# Patient Record
Sex: Female | Born: 1995 | Race: White | Hispanic: No | Marital: Single | Smoking: Current every day smoker
Health system: Southern US, Community
[De-identification: ages and names within clinical notes are randomized; demographics above are authoritative.]

## PROBLEM LIST (undated history)

## (undated) HISTORY — PX: FOOT SURGERY: SHX648

---
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
## 2016-12-18 IMAGING — CR DG FINGER MIDDLE 2+V*R*
1 series · 3 of 3 positions shown · non-contrast
Comparison: None

CLINICAL DATA: Smashed RIGHT middle finger in door 2 days ago
tearing off nail, pain at distal phalanx

EXAM:
RIGHT MIDDLE FINGER 2+V

[Series 1: pa · 0.17mm/px · 3 of 3 slices shown]
[im 1/3]
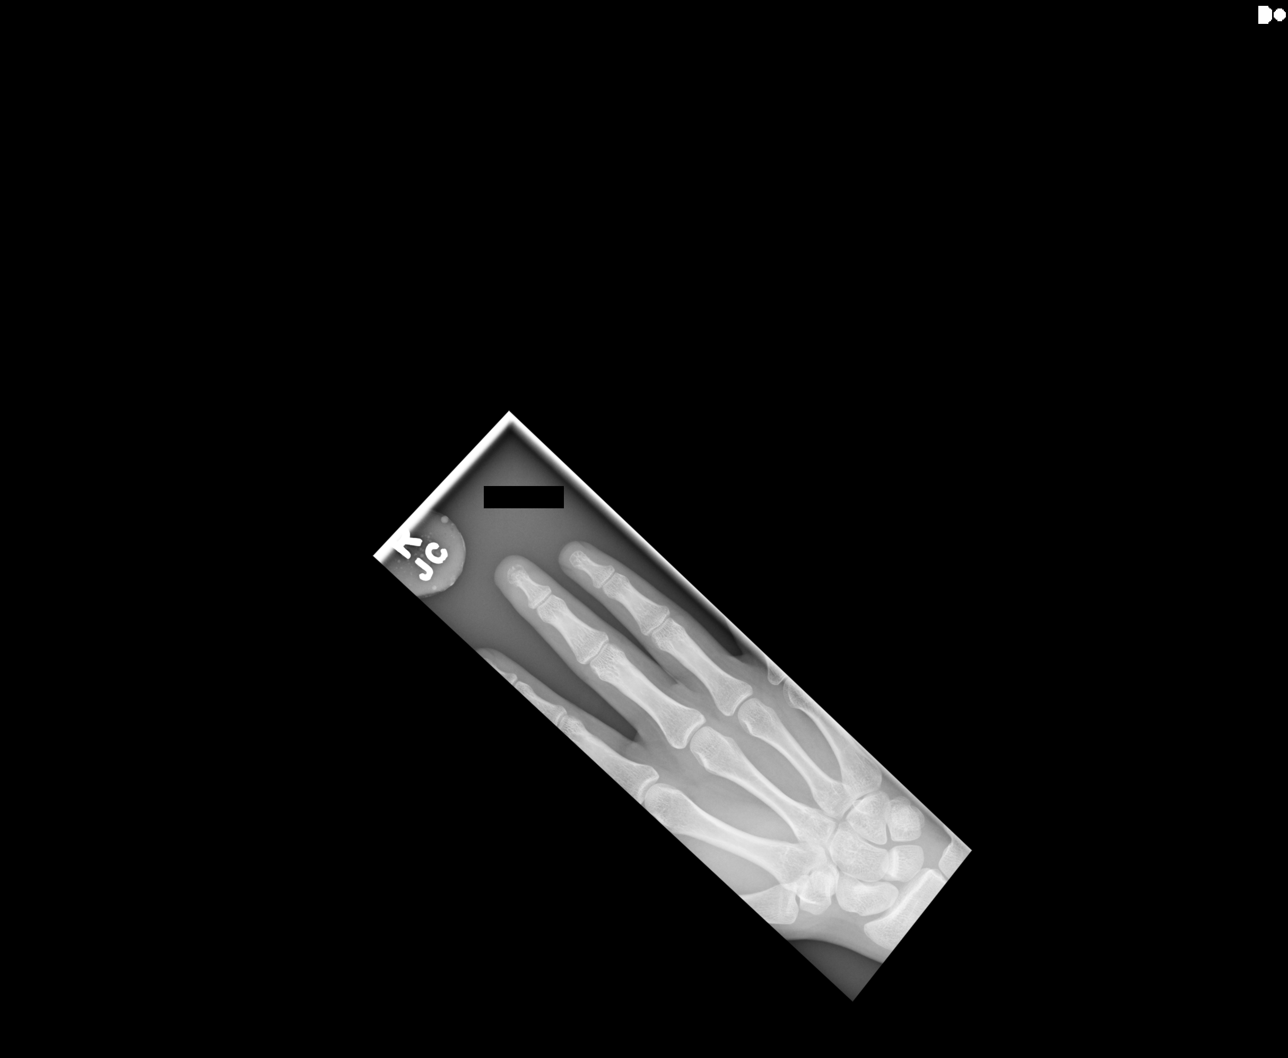
[im 2/3]
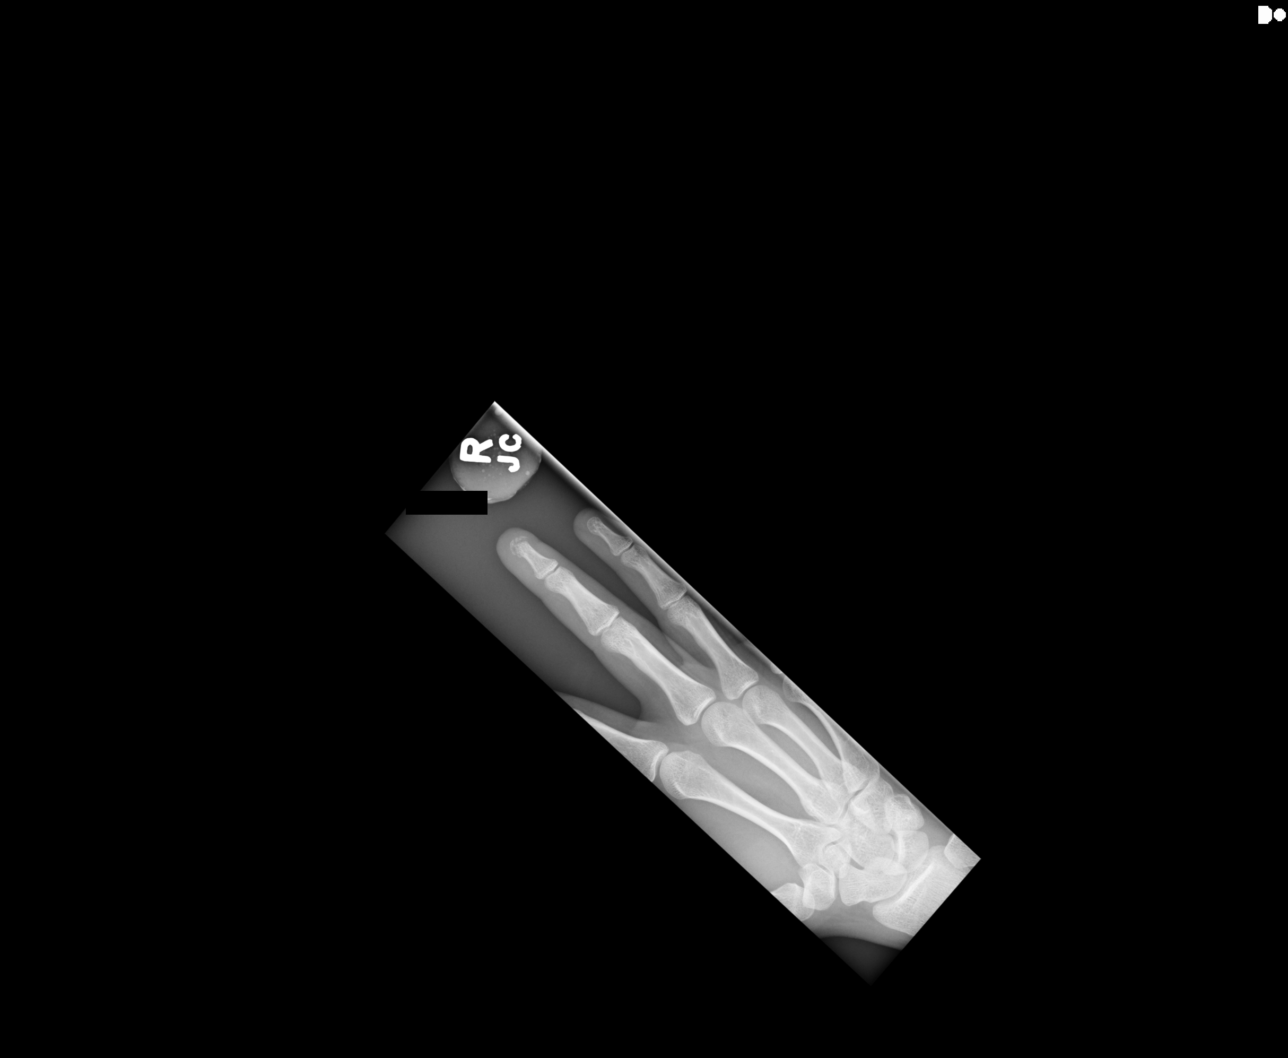
[im 3/3]
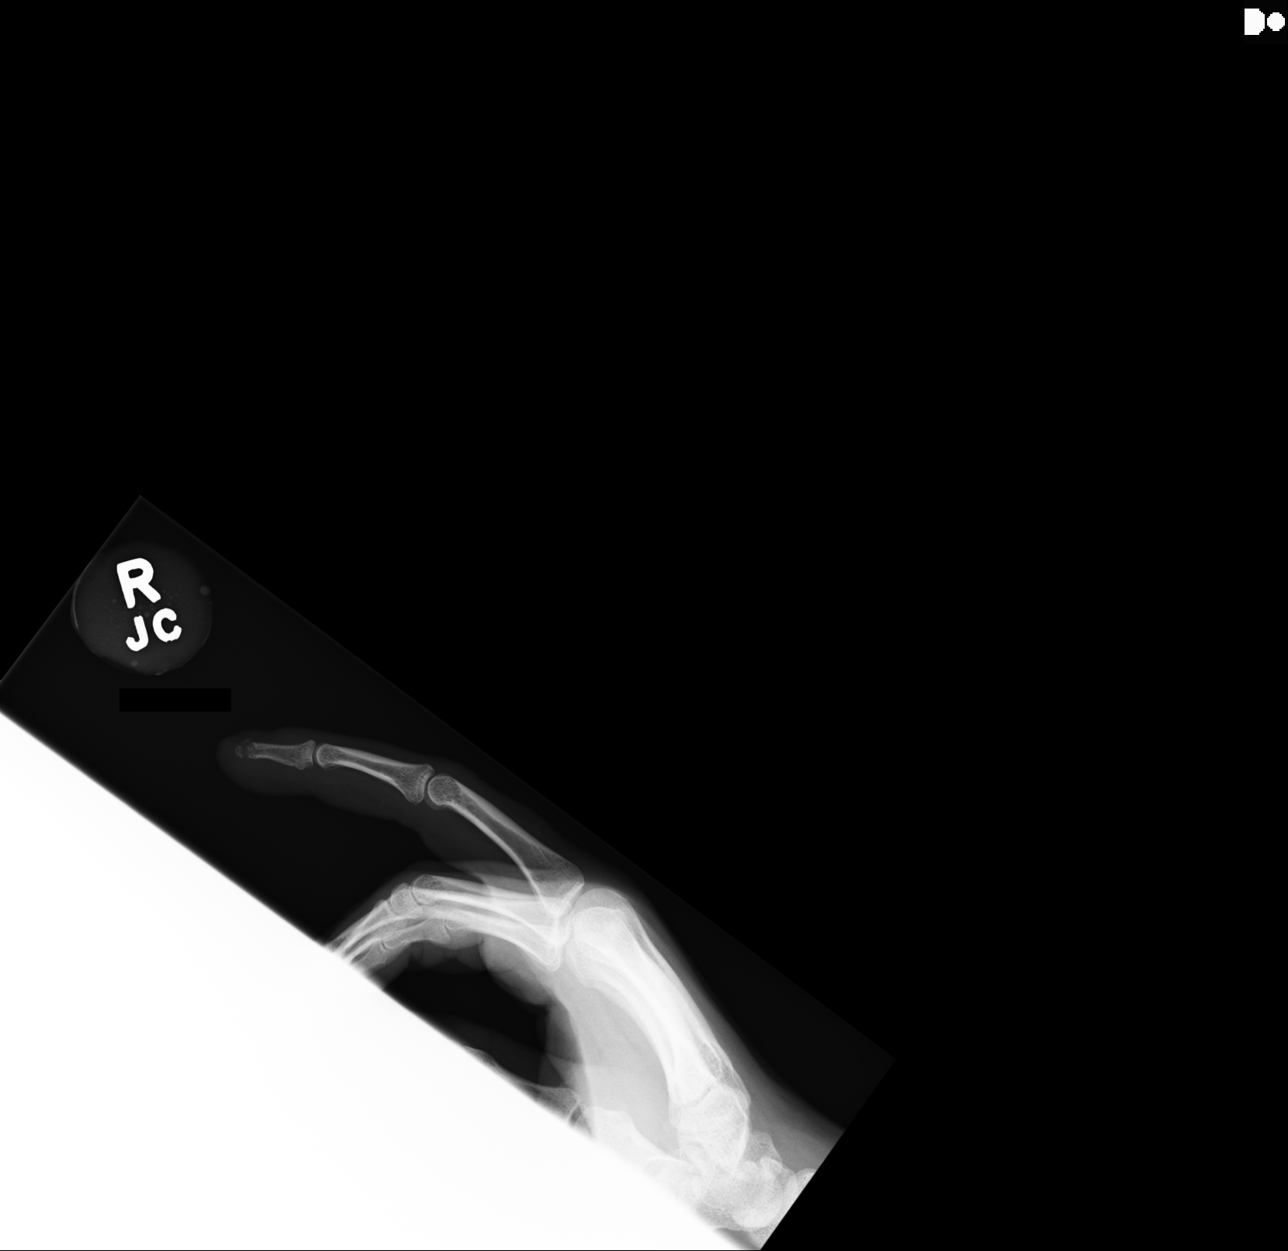

[3 of 3 positions shown; findings below may reference images not displayed]

FINDINGS: Osseous mineralization normal.

Joint spaces preserved.

Comminuted tuft fracture distal phalanx, minimally displaced.

No additional fracture, dislocation or bone destruction.

Associated soft tissue swelling.
IMPRESSION: Mildly displaced comminuted tuft fracture distal phalanx RIGHT
middle finger.

## 2017-10-05 ENCOUNTER — Encounter: Payer: Self-pay | Admitting: Emergency Medicine

## 2017-10-05 ENCOUNTER — Emergency Department
Admission: EM | Admit: 2017-10-05 | Discharge: 2017-10-05 | Disposition: A | Discharge status: Home / Self Care | Payer: BLUE CROSS/BLUE SHIELD | Attending: Emergency Medicine | Admitting: Emergency Medicine

## 2017-10-05 DIAGNOSIS — Y929 Unspecified place or not applicable: Secondary | ICD-10-CM | POA: Insufficient documentation

## 2017-10-05 DIAGNOSIS — S90852A Superficial foreign body, left foot, initial encounter: Secondary | ICD-10-CM | POA: Insufficient documentation

## 2017-10-05 DIAGNOSIS — B3731 Acute candidiasis of vulva and vagina: Secondary | ICD-10-CM

## 2017-10-05 DIAGNOSIS — B373 Candidiasis of vulva and vagina: Secondary | ICD-10-CM | POA: Insufficient documentation

## 2017-10-05 DIAGNOSIS — W458XXA Other foreign body or object entering through skin, initial encounter: Secondary | ICD-10-CM | POA: Insufficient documentation

## 2017-10-05 DIAGNOSIS — F17210 Nicotine dependence, cigarettes, uncomplicated: Secondary | ICD-10-CM | POA: Insufficient documentation

## 2017-10-05 DIAGNOSIS — Y999 Unspecified external cause status: Secondary | ICD-10-CM | POA: Insufficient documentation

## 2017-10-05 DIAGNOSIS — Y939 Activity, unspecified: Secondary | ICD-10-CM | POA: Insufficient documentation

## 2017-10-05 MED ORDER — FLUCONAZOLE 50 MG PO TABS
150.0000 mg | ORAL_TABLET | Freq: Once | ORAL | Status: AC
  Administered 2017-10-05: 150 mg via ORAL
  Filled 2017-10-05: qty 1

## 2017-10-05 MED ORDER — FLUCONAZOLE 150 MG PO TABS: 150.0000 mg | ORAL_TABLET | Freq: Once | ORAL | 0 refills | Status: AC

## 2017-10-05 NOTE — ED Triage Notes (Signed)
Pt comes into the ED via POV c/o glass in her left foot that occurred today as well as a possible yeast infection.  Patient states the glass is small and unable to be seen sticking out of the foot.  Patient c/o white thick discharge.  Patient also explains that it is itching.  Patient used monistat with no relief.

## 2017-10-05 NOTE — Discharge Instructions (Signed)
If no improvement of vaginal discharge in the next 2-3 days follow-up with the health department.  Return to the emergency department for any pain, fevers or worsening symptoms or urgent changes in your health.  Keep an eye on your left foot, if any redness warmth or drainage return to the emergency department.

## 2017-10-05 NOTE — ED Provider Notes (Signed)
[facility] EMERGENCY DEPARTMENT Provider Note   CSN: [id number] Arrival date & time: 10/05/17  1644     History   Chief Complaint Chief Complaint  Patient presents with  . Foreign Body in Skin  . Vaginitis    HPI Quintella ReichertKaleigh E Lusignan is a 21 y.o. female presents to the emergency department for evaluation of yeast infection and foreign body to the left foot.  Patient states of the last 2-3 days she has had white discharge with itching.  She states that she was recently went to the health department to have her Implanon removed, was swabbed and checked for STDs and bacterial vaginitis.  She states that over the last couple days she has had some white discharge with itching that resembles previous episode of Candida infection.  Recent episode responded well to Monistat, this episode is only minimally improved with Monistat.  She denies any abdominal pain, pelvic pain, fevers.  She denies any odor.  Patient also states earlier today she stepped on a small piece of glass in her bathroom.  Patient thought she had cleaned up all the broken glass from a week ago.  Her tetanus is up-to-date.  She has a small piece of glass on the plantar aspect of her left foot that is painful to walk.  She has not had medications for pain.  She denies any redness, warmth, swelling, drainage.  HPI  History reviewed. No pertinent past medical history.  There are no active problems to display for this patient.   Past Surgical History:  Procedure Laterality Date  . FOOT SURGERY      OB History    No data available       Home Medications    Prior to Admission medications   Medication Sig Start Date End Date Taking? Authorizing Provider  fluconazole (DIFLUCAN) 150 MG tablet Take 1 tablet (150 mg total) by mouth once for 1 dose. 10/05/17 10/05/17  Evon SlackGaines, Thomas C, PA-C  HYDROcodone-acetaminophen (NORCO) 5-325 MG per tablet Take 1-2 tablets by mouth every 4 (four) hours as needed for  moderate pain. 05/13/15   Beers, Charmayne Sheerharles M, PA-C  ibuprofen (ADVIL,MOTRIN) 800 MG tablet Take 1 tablet (800 mg total) by mouth every 8 (eight) hours as needed. 05/13/15   Evangeline DakinBeers, Charles M, PA-C    Family History No family history on file.  Social History Social History   Tobacco Use  . Smoking status: Current Every Day Smoker    Packs/day: 0.50    Types: Cigarettes  . Smokeless tobacco: Never Used  Substance Use Topics  . Alcohol use: Yes    Comment: occasional  . Drug use: Yes    Types: Marijuana     Allergies   Patient has no known allergies.   Review of Systems Review of Systems  Constitutional: Negative for fever.  Respiratory: Negative for shortness of breath.   Cardiovascular: Negative for chest pain.  Gastrointestinal: Negative for abdominal pain.  Genitourinary: Positive for vaginal discharge. Negative for difficulty urinating, dysuria, pelvic pain, urgency, vaginal bleeding and vaginal pain.  Musculoskeletal: Positive for gait problem. Negative for back pain and myalgias.  Skin: Negative for rash and wound.  Neurological: Negative for dizziness and headaches.     Physical Exam Updated Vital Signs BP 112/60 (BP Location: Right Arm)   Pulse 84   Temp 98.2 F (36.8 C) (Oral)   Resp 15   Ht 5\' 5"  (1.651 m)   Wt 99.8 kg (220 lb)   LMP  (  LMP Unknown)   SpO2 97%   BMI 36.61 kg/m   Physical Exam  Constitutional: She is oriented to person, place, and time. She appears well-developed and well-nourished. No distress.  HENT:  Head: Normocephalic and atraumatic.  Mouth/Throat: Oropharynx is clear and moist.  Eyes: Conjunctivae are normal. Right eye exhibits no discharge. Left eye exhibits no discharge.  Neck: Normal range of motion.  Cardiovascular: Normal rate.  Pulmonary/Chest: No respiratory distress.  Abdominal: Soft. She exhibits no distension. There is no tenderness. There is no guarding.  Musculoskeletal: Normal range of motion. She exhibits no  deformity.  Examination of the left foot shows a small piece of glass that is barely visible but palpable along the superficial portion of the plantar aspect of the left foot.  Patient is tender to palpation.  This is successfully removed with scalpel and forceps.  There was no redness, warmth, drainage.  Piece of glass was extremely tiny, less than 1 mm in length in diameter.  After removal of the piece of glass, patient without any pain or discomfort with palpation or weightbearing.  Neurological: She is alert and oriented to person, place, and time. She has normal reflexes.  Skin: Skin is warm and dry. No rash noted.  Psychiatric: She has a normal mood and affect. Her behavior is normal. Thought content normal.     ED Treatments / Results  Labs (all labs ordered are listed, but only abnormal results are displayed) Labs Reviewed - No data to display  EKG  EKG Interpretation None       Radiology No results found.  Procedures .Foreign Body Removal Date/Time: 10/05/2017 5:56 PM Performed by: Evon SlackGaines, Thomas C, PA-C Authorized by: Evon SlackGaines, Thomas C, PA-C  Consent: Verbal consent obtained. Risks and benefits: risks, benefits and alternatives were discussed Consent given by: patient Patient understanding: patient states understanding of the procedure being performed Imaging studies: imaging studies not available Patient identity confirmed: verbally with patient Body area: skin General location: lower extremity Location details: left foot Anesthesia method: none. Patient cooperative: yes Removal mechanism: forceps and scalpel Dressing: dressing applied Tendon involvement: none Depth: Superficial  Complexity: simple 1 objects recovered. Objects recovered: Small piece of glass Post-procedure assessment: foreign body removed Patient tolerance: Patient tolerated the procedure well with no immediate complications   (including critical care time)  Medications Ordered in  ED Medications  fluconazole (DIFLUCAN) tablet 150 mg (not administered)     Initial Impression / Assessment and Plan / ED Course  I have reviewed the triage vital signs and the nursing notes.  Pertinent labs & imaging results that were available during my care of the patient were reviewed by me and considered in my medical decision making (see chart for details).     21 year old female with foreign body removal of the left foot.  Superficial piece of glass was removed.  Tetanus is up-to-date.  Pain from foreign body resolved after removal.  Patient was educated on wound care and signs and symptoms to return to the ED for.  The foreign body was extremely superficial and tiny.  Patient also with signs and symptoms consistent with vaginal candidiasis.  She has had recent workup at health department that was negative.  Will treat with fluconazole.  If no improvement, she will follow-up with health department. Final Clinical Impressions(s) / ED Diagnoses   Final diagnoses:  Vaginitis due to Candida  Foreign body in left foot, initial encounter    ED Discharge Orders  Ordered    fluconazole (DIFLUCAN) 150 MG tablet   Once     10/05/17 1747       Ronnette Juniper 10/05/17 1759    Phineas Semen, MD 10/05/17 [phone]

## 2017-10-10 ENCOUNTER — Encounter: Payer: Self-pay | Admitting: Emergency Medicine

## 2017-10-10 ENCOUNTER — Other Ambulatory Visit: Payer: Self-pay

## 2017-10-10 ENCOUNTER — Emergency Department
Admission: EM | Admit: 2017-10-10 | Discharge: 2017-10-10 | Disposition: A | Discharge status: Home / Self Care | Payer: Self-pay | Attending: Emergency Medicine | Admitting: Emergency Medicine

## 2017-10-10 DIAGNOSIS — B3731 Acute candidiasis of vulva and vagina: Secondary | ICD-10-CM

## 2017-10-10 DIAGNOSIS — B373 Candidiasis of vulva and vagina: Secondary | ICD-10-CM | POA: Insufficient documentation

## 2017-10-10 DIAGNOSIS — F17210 Nicotine dependence, cigarettes, uncomplicated: Secondary | ICD-10-CM | POA: Insufficient documentation

## 2017-10-10 MED ORDER — FLUCONAZOLE 50 MG PO TABS
150.0000 mg | ORAL_TABLET | Freq: Once | ORAL | Status: AC
  Administered 2017-10-10: 150 mg via ORAL
  Filled 2017-10-10: qty 1

## 2017-10-10 MED ORDER — FLUCONAZOLE 100 MG PO TABS: ORAL_TABLET | ORAL | 0 refills | Status: DC

## 2017-10-10 NOTE — ED Notes (Signed)
Pt states currently having white thick discharge that started around a week ago. Already tried monistat with no relief.

## 2017-10-10 NOTE — ED Triage Notes (Signed)
Arrives with c/o yeast infection.  Patient states she was seen through the health department and was diagnosed with yeast, given diflucan, which has been ineffective.

## 2017-10-10 NOTE — ED Provider Notes (Signed)
[facility] Emergency Department Provider Note  ____________________________________________  Time seen: Approximately 7:16 PM  I have reviewed the triage vital signs and the nursing notes.   HISTORY  Chief Complaint Vaginal Discharge    HPI Samantha Jackson is a 21 y.o. female that presents to emergency department for evaluation of white vaginal discharge for 1.5 weeks.  Patient went to the health department and tested positive for yeast.  She was checked for STDs, which tested negative.  She was given a dose of Diflucan in the ED, which helped symptoms but did they did not resolve.  No fever, chills, nausea, vomiting, abdominal pain, dysuria, urgency, frequency.   History reviewed. No pertinent past medical history.  There are no active problems to display for this patient.   Past Surgical History:  Procedure Laterality Date  . FOOT SURGERY      Prior to Admission medications   Medication Sig Start Date End Date Taking? Authorizing Provider  fluconazole (DIFLUCAN) 100 MG tablet Take 1.5 tablets on 12/1 and take 1.5 tablets on 12/4 10/10/17   Enid DerryWagner, Ladale Sherburn, PA-C  HYDROcodone-acetaminophen (NORCO) 5-325 MG per tablet Take 1-2 tablets by mouth every 4 (four) hours as needed for moderate pain. 05/13/15   Beers, Charmayne Sheerharles M, PA-C  ibuprofen (ADVIL,MOTRIN) 800 MG tablet Take 1 tablet (800 mg total) by mouth every 8 (eight) hours as needed. 05/13/15   Evangeline DakinBeers, Charles M, PA-C    Allergies Patient has no known allergies.  No family history on file.  Social History Social History   Tobacco Use  . Smoking status: Current Every Day Smoker    Packs/day: 0.50    Types: Cigarettes  . Smokeless tobacco: Never Used  Substance Use Topics  . Alcohol use: Yes    Comment: occasional  . Drug use: Yes    Types: Marijuana     Review of Systems  Constitutional: No fever/chills Cardiovascular: No chest pain. Respiratory: No SOB. Gastrointestinal: No abdominal  pain.  No nausea, no vomiting.  Musculoskeletal: Negative for musculoskeletal pain. Skin: Negative for rash, abrasions, lacerations, ecchymosis. Neurological: Negative for headaches, numbness or tingling   ____________________________________________   PHYSICAL EXAM:  VITAL SIGNS: ED Triage Vitals  Enc Vitals Group     BP 10/10/17 1819 126/65     Pulse Rate 10/10/17 1819 81     Resp 10/10/17 1819 20     Temp 10/10/17 1819 98.2 F (36.8 C)     Temp Source 10/10/17 1819 Oral     SpO2 10/10/17 1819 100 %     Weight 10/10/17 1815 200 lb (90.7 kg)     Height 10/10/17 1815 5\' 5"  (1.651 m)     Head Circumference --      Peak Flow --      Pain Score 10/10/17 1815 0     Pain Loc --      Pain Edu? --      Excl. in GC? --      Constitutional: Alert and oriented. Well appearing and in no acute distress. Eyes: Conjunctivae are normal. PERRL. EOMI. Head: Atraumatic. ENT:      Ears:      Nose: No congestion/rhinnorhea.      Mouth/Throat: Mucous membranes are moist.  Neck: No stridor.   Cardiovascular: Normal rate, regular rhythm.  Good peripheral circulation. Respiratory: Normal respiratory effort without tachypnea or retractions. Lungs CTAB. Good air entry to the bases with no decreased or absent breath sounds. Gastrointestinal: Bowel sounds 4 quadrants. Soft  and nontender to palpation. No guarding or rigidity. No palpable masses. No distention Musculoskeletal: Full range of motion to all extremities. No gross deformities appreciated. Neurologic:  Normal speech and language. No gross focal neurologic deficits are appreciated.  Skin:  Skin is warm, dry and intact. No rash noted.   ____________________________________________   LABS (all labs ordered are listed, but only abnormal results are displayed)  Labs Reviewed - No data to display ____________________________________________  EKG   ____________________________________________  RADIOLOGY   No results  found.  ____________________________________________    PROCEDURES  Procedure(s) performed:    Procedures    Medications  fluconazole (DIFLUCAN) tablet 150 mg (not administered)     ____________________________________________   INITIAL IMPRESSION / ASSESSMENT AND PLAN / ED COURSE  Pertinent labs & imaging results that were available during my care of the patient were reviewed by me and considered in my medical decision making (see chart for details).  Review of the [place] CSRS was performed in accordance of the [facility] prior to dispensing any controlled drugs.  Patient's diagnosis is consistent with yeast infection.  Vital signs and exam are reassuring.  Patient tested positive for yeast at the health department 1.5 weeks ago.  She has taken 1 dose of Diflucan, which improved but has not resolved symptoms. We will try 3 doses of diflucan 72 hours apart.  Patient will be discharged home with prescriptions for diflucan. Patient is to follow up with health department as directed. Patient is given ED precautions to return to the ED for any worsening or new symptoms.   ____________________________________________  FINAL CLINICAL IMPRESSION(S) / ED DIAGNOSES  Final diagnoses:  Vaginal yeast infection       This chart was dictated using voice recognition software/Dragon. Despite best efforts to proofread, errors can occur which can change the meaning. Any change was purely unintentional.   Enid DerryWagner, Graycen Sadlon, PA-C 10/10/17 1956  Sharman CheekStafford, Phillip, MD 10/10/17 2308

## 2017-11-08 ENCOUNTER — Emergency Department
Admission: EM | Admit: 2017-11-08 | Discharge: 2017-11-08 | Disposition: A | Discharge status: Home / Self Care | Payer: Self-pay | Attending: Emergency Medicine | Admitting: Emergency Medicine

## 2017-11-08 ENCOUNTER — Other Ambulatory Visit: Payer: Self-pay

## 2017-11-08 DIAGNOSIS — N946 Dysmenorrhea, unspecified: Secondary | ICD-10-CM | POA: Insufficient documentation

## 2017-11-08 LAB — URINALYSIS, COMPLETE (UACMP) WITH MICROSCOPIC
BILIRUBIN URINE: NEGATIVE
Bacteria, UA: NONE SEEN
GLUCOSE, UA: NEGATIVE mg/dL
Hgb urine dipstick: NEGATIVE
Ketones, ur: NEGATIVE mg/dL
Leukocytes, UA: NEGATIVE
Nitrite: NEGATIVE
PROTEIN: NEGATIVE mg/dL
RBC / HPF: NONE SEEN RBC/hpf (ref 0–5)
Specific Gravity, Urine: 1.005 (ref 1.005–1.030)
WBC, UA: NONE SEEN WBC/hpf (ref 0–5)
pH: 6 (ref 5.0–8.0)

## 2017-11-08 LAB — POCT PREGNANCY, URINE: Preg Test, Ur: NEGATIVE

## 2017-11-08 MED ORDER — IBUPROFEN 800 MG PO TABS
800.0000 mg | ORAL_TABLET | Freq: Once | ORAL | Status: AC
  Administered 2017-11-08: 800 mg via ORAL
  Filled 2017-11-08: qty 1

## 2017-11-08 MED ORDER — IBUPROFEN 800 MG PO TABS
800.0000 mg | ORAL_TABLET | Freq: Once | ORAL | Status: AC
  Administered 2017-11-08: 800 mg via ORAL

## 2017-11-08 MED ORDER — IBUPROFEN 800 MG PO TABS
ORAL_TABLET | ORAL | Status: AC
  Filled 2017-11-08: qty 1

## 2017-11-08 NOTE — ED Provider Notes (Signed)
[facility] Emergency Department Provider Note   First MD Initiated Contact with Patient 11/08/17 [phone]     (approximate)  I have reviewed the triage vital signs and the nursing notes.   HISTORY  Chief Complaint Abdominal Pain   HPI Samantha Jackson is a 21 y.o. female presents to the emergency department with "painful.".  Patient states that she had Nexplanon removed on 10/28/2018 because "I want to be regulated again".  Patient states on December 25 menstrual cramps started followed by vaginal bleeding.  Patient pain score on arrival 7 out of 10.  Patient received ibuprofen 800 mg while in the waiting room with improvement of pain.  Patient denies any vomiting no diarrhea.  Patient denies any fever afebrile on presentation.  Patient has no plans for birth control at this time.  Past medical history None There are no active problems to display for this patient.   Past Surgical History:  Procedure Laterality Date  . FOOT SURGERY      Prior to Admission medications   Medication Sig Start Date End Date Taking? Authorizing Provider  fluconazole (DIFLUCAN) 100 MG tablet Take 1.5 tablets on 12/1 and take 1.5 tablets on 12/4 10/10/17   Enid DerryWagner, Ashley, PA-C  HYDROcodone-acetaminophen (NORCO) 5-325 MG per tablet Take 1-2 tablets by mouth every 4 (four) hours as needed for moderate pain. 05/13/15   Beers, Charmayne Sheerharles M, PA-C  ibuprofen (ADVIL,MOTRIN) 800 MG tablet Take 1 tablet (800 mg total) by mouth every 8 (eight) hours as needed. 05/13/15   Beers, Charmayne Sheerharles M, PA-C    Allergies No known drug allergies No family history on file.  Social History Social History   Tobacco Use  . Smoking status: Current Every Day Smoker    Packs/day: 0.50    Types: Cigarettes  . Smokeless tobacco: Never Used  Substance Use Topics  . Alcohol use: Yes    Comment: occasional  . Drug use: Yes    Types: Marijuana    Review of Systems Constitutional: No fever/chills Eyes: No  visual changes. ENT: No sore throat. Cardiovascular: Denies chest pain. Respiratory: Denies shortness of breath. Gastrointestinal: No abdominal pain.  No nausea, no vomiting.  No diarrhea.  No constipation. Genitourinary: Negative for dysuria.  Positive for pelvic pain. Musculoskeletal: Negative for neck pain.  Negative for back pain. Integumentary: Negative for rash. Neurological: Negative for headaches, focal weakness or numbness.   ____________________________________________   PHYSICAL EXAM:  VITAL SIGNS: ED Triage Vitals [11/08/17 0016]  Enc Vitals Group     BP 129/80     Pulse Rate 95     Resp 18     Temp 98 F (36.7 C)     Temp Source Oral     SpO2 98 %     Weight 99.8 kg (220 lb)     Height 1.524 m (5')     Head Circumference      Peak Flow      Pain Score 7     Pain Loc      Pain Edu?      Excl. in GC?     Constitutional: Alert and oriented. Well appearing and in no acute distress. Eyes: Conjunctivae are normal.  Head: Atraumatic. Mouth/Throat: Mucous membranes are moist. Oropharynx non-erythematous Neck: No stridor.   Cardiovascular: Normal rate, regular rhythm. Good peripheral circulation. Grossly normal heart sounds. Respiratory: Normal respiratory effort.  No retractions. Lungs CTAB. Gastrointestinal: Soft and nontender. No distention.  Musculoskeletal: No lower extremity tenderness nor  edema. No gross deformities of extremities. Neurologic:  Normal speech and language. No gross focal neurologic deficits are appreciated.  Skin:  Skin is warm, dry and intact. No rash noted. Psychiatric: Mood and affect are normal. Speech and behavior are normal.  ____________________________________________   LABS (all labs ordered are listed, but only abnormal results are displayed)  Labs Reviewed  URINALYSIS, COMPLETE (UACMP) WITH MICROSCOPIC - Abnormal; Notable for the following components:      Result Value   Color, Urine STRAW (*)    APPearance CLEAR (*)     Squamous Epithelial / LPF 0-5 (*)    All other components within normal limits  POC URINE PREG, ED  POCT PREGNANCY, URINE       Procedures   ____________________________________________   INITIAL IMPRESSION / ASSESSMENT AND PLAN / ED COURSE  As part of my medical decision making, I reviewed the following data within the electronic MEDICAL RECORD 89 year old female present with history of physical exam consistent with painful menstruation.  Patient given ibuprofen with the patient states that she is unable to purchase any medication for discomfort and as such she was given ibuprofen 800 mg to be taking in 8 hours as needed.   ________________________________  FINAL CLINICAL IMPRESSION(S) / ED DIAGNOSES  Final diagnoses:  Menstrual pain     MEDICATIONS GIVEN DURING THIS VISIT:  Medications  ibuprofen (ADVIL,MOTRIN) tablet 800 mg (800 mg Oral Given 11/08/17 0022)     ED Discharge Orders    None       Note:  This document was prepared using Dragon voice recognition software and may include unintentional dictation errors.    Darci CurrentBrown, [place] N, MD 11/08/17 [phone]

## 2017-11-08 NOTE — ED Notes (Signed)
ED Provider at bedside. 

## 2017-11-08 NOTE — ED Notes (Signed)

## 2017-11-08 NOTE — ED Triage Notes (Signed)
Pt had IUD taken out 12/16 and started first cycle since insertion 3 years ago. Here for severe cramps, states "cannot afford midol".

## 2019-03-19 ENCOUNTER — Encounter: Payer: Self-pay | Admitting: Emergency Medicine

## 2019-03-19 ENCOUNTER — Other Ambulatory Visit: Payer: Self-pay

## 2019-03-19 ENCOUNTER — Ambulatory Visit
Admission: EM | Admit: 2019-03-19 | Discharge: 2019-03-19 | Disposition: A | Discharge status: Home / Self Care | Payer: BLUE CROSS/BLUE SHIELD | Attending: Family Medicine | Admitting: Family Medicine

## 2019-03-19 DIAGNOSIS — J029 Acute pharyngitis, unspecified: Secondary | ICD-10-CM | POA: Diagnosis not present

## 2019-03-19 LAB — RAPID STREP SCREEN (MED CTR MEBANE ONLY): Streptococcus, Group A Screen (Direct): NEGATIVE

## 2019-03-19 MED ORDER — AMOXICILLIN 500 MG PO TABS: 500.0000 mg | ORAL_TABLET | Freq: Two times a day (BID) | ORAL | 0 refills | Status: AC

## 2019-03-19 NOTE — ED Triage Notes (Signed)
Patient c/o sore throat that started 2 days ago. She states she has white spots on the back of her throat.

## 2019-03-19 NOTE — ED Provider Notes (Signed)
[facility]  CSN: [id number] Arrival date & time: 03/19/19  1413  History   Chief Complaint Chief Complaint  Patient presents with  . Sore Throat   HPI 23 year old female presents with sore throat.  Patient reports a 2-day history of sore throat.  Worse with swallowing.  She has noticed white exudate.  No fever.  No chills.  She reports lymphadenopathy as well.  No reported sick contacts.  No recent travel.  She has used salt water gargles and peroxide with some improvement.  Her pain is mild currently, 2/10 in severity.  No other associated symptoms.  No other complaints or concerns at this time.  Hx reviewed as below. PMH: Obesity.  Past Surgical History:  Procedure Laterality Date  . FOOT SURGERY     OB History   No obstetric history on file.    Home Medications    Prior to Admission medications   Medication Sig Start Date End Date Taking? Authorizing Provider  amoxicillin (AMOXIL) 500 MG tablet Take 1 tablet (500 mg total) by mouth 2 (two) times daily. 03/19/19   Tommie Sams, DO   Social History Social History   Tobacco Use  . Smoking status: Current Every Day Smoker    Packs/day: 0.50    Types: Cigarettes  . Smokeless tobacco: Never Used  Substance Use Topics  . Alcohol use: Yes    Comment: occasional  . Drug use: Not Currently    Types: Marijuana    Allergies   Patient has no known allergies.   Review of Systems Review of Systems  Constitutional: Negative for fever.  HENT: Positive for sore throat and trouble swallowing.   Hematological: Positive for adenopathy.   Physical Exam Triage Vital Signs ED Triage Vitals  Enc Vitals Group     BP 03/19/19 1429 (!) 143/82     Pulse Rate 03/19/19 1429 98     Resp 03/19/19 1429 18     Temp 03/19/19 1429 98.6 F (37 C)     Temp Source 03/19/19 1429 Oral     SpO2 03/19/19 1429 98 %     Weight 03/19/19 1430 215 lb (97.5 kg)     Height 03/19/19 1430 5\' 5"  (1.651 m)     Head Circumference --    Peak Flow --      Pain Score 03/19/19 1429 2     Pain Loc --      Pain Edu? --      Excl. in GC? --    Updated Vital Signs BP (!) 143/82 (BP Location: Right Arm)   Pulse 98   Temp 98.6 F (37 C) (Oral)   Resp 18   Ht 5\' 5"  (1.651 m)   Wt 97.5 kg   LMP 03/12/2019   SpO2 98%   BMI 35.78 kg/m   Visual Acuity Right Eye Distance:   Left Eye Distance:   Bilateral Distance:    Right Eye Near:   Left Eye Near:    Bilateral Near:     Physical Exam Vitals signs and nursing note reviewed.  Constitutional:      General: She is not in acute distress.    Appearance: Normal appearance.  HENT:     Head: Normocephalic and atraumatic.     Mouth/Throat:     Comments: Oropharynx with moderate erythema.  Moderate tonsillar swelling.  Bilateral tonsillar exudate. Eyes:     General:        Right eye: No discharge.  Left eye: No discharge.     Conjunctiva/sclera: Conjunctivae normal.  Neck:     Musculoskeletal: Neck supple.  Cardiovascular:     Rate and Rhythm: Normal rate and regular rhythm.  Pulmonary:     Effort: Pulmonary effort is normal.     Breath sounds: Normal breath sounds.  Lymphadenopathy:     Cervical: Cervical adenopathy present.  Neurological:     Mental Status: She is alert.  Psychiatric:        Mood and Affect: Mood normal.        Behavior: Behavior normal.    UC Treatments / Results  Labs (all labs ordered are listed, but only abnormal results are displayed) Labs Reviewed  RAPID STREP SCREEN ([facility] ONLY)  CULTURE, GROUP A STREP [facility])    EKG None  Radiology No results found.  Procedures Procedures (including critical care time)  Medications Ordered in UC Medications - No data to display  Initial Impression / Assessment and Plan / UC Course  I have reviewed the triage vital signs and the nursing notes.  Pertinent labs & imaging results that were available during my care of the patient were reviewed by me and considered in my  medical decision making (see chart for details).    23 year old female presents with pharyngitis.  Strep was negative today.  Awaiting culture.  Given her clinical exam, I am placing her on amoxicillin while a culture.  Final Clinical Impressions(s) / UC Diagnoses   Final diagnoses:  Pharyngitis, unspecified etiology     Discharge Instructions     Antibiotic as prescribed (while we await the culture).  Call with concerns.  Take care  Dr. Adriana Simasook    ED Prescriptions    Medication Sig Dispense Auth. Provider   amoxicillin (AMOXIL) 500 MG tablet Take 1 tablet (500 mg total) by mouth 2 (two) times daily. 20 tablet Tommie Samsook, Merial Moritz G, DO     Controlled Substance Prescriptions [place] Controlled Substance Registry consulted? Not Applicable   Tommie SamsCook, Rosabell Geyer G, DO 03/19/19 1526

## 2019-03-19 NOTE — Discharge Instructions (Signed)
Antibiotic as prescribed (while we await the culture).  Call with concerns.  Take care  Dr. Adriana Simas

## 2019-03-22 LAB — CULTURE, GROUP A STREP (THRC)

## 2019-08-01 ENCOUNTER — Encounter: Admit: 2019-08-01 | Discharge: 2019-08-01 | Payer: BC Managed Care – PPO

## 2019-08-01 ENCOUNTER — Ambulatory Visit: Admit: 2019-08-01 | Discharge: 2019-08-02 | Payer: BC Managed Care – PPO

## 2019-08-01 MED ORDER — CEPHALEXIN 500 MG PO CAP
500 mg | ORAL_CAPSULE | Freq: Four times a day (QID) | ORAL | 0 refills | Status: AC
Start: 2019-08-01 — End: ?

## 2019-08-01 MED ORDER — CEPHALEXIN 500 MG PO CAP
500 mg | ORAL_CAPSULE | Freq: Four times a day (QID) | ORAL | 0 refills | Status: DC
Start: 2019-08-01 — End: 2019-08-01

## 2019-08-01 NOTE — Progress Notes
Date of Service: 08/01/2019    Christine Bush is a 23 y.o. female.  DOB: 08/31/1996  MRN: [id number]       Subjective:            Chief Complaint   Patient presents with   ? Sore Throat     pt stated it all started on Tuesday   ? Bladder infection            Sore Throat    This is a new problem. The current episode started in the past 7 days (started 07/29/2019). The problem has been gradually worsening. The pain is worse on the left side. There has been no fever. The pain is at a severity of 6/10. The pain is moderate. Associated symptoms include a hoarse voice, swollen glands and trouble swallowing. Pertinent negatives include no abdominal pain, congestion, coughing, diarrhea, ear pain, plugged ear sensation, shortness of breath or vomiting. She has tried NSAIDs and cool liquids for the symptoms. The treatment provided mild relief.   Urinary Pain    This is a new problem. The current episode started in the past 7 days (started 07/29/2019). The problem occurs every urination. The problem has been gradually worsening. The quality of the pain is described as burning and aching. The pain is at a severity of 4/10. The pain is mild. She is sexually active. There is no history of pyelonephritis. Associated symptoms include frequency and hematuria. Pertinent negatives include no discharge, flank pain, nausea, possible pregnancy (LMP: 07/09/2019), urgency or vomiting. She has tried increased fluids (took AZO on Wednesday which helped) for the symptoms. The treatment provided mild relief. There is no history of kidney stones, recurrent UTIs or a single kidney.     She denies any history of strep throat.     Fatuma Canizales has no past medical history on file.    She has no past surgical history on file.    Fayette's family history is not on file.           Review of Systems   Constitutional: Negative for fever.   HENT: Positive for hoarse voice, sore throat and trouble swallowing. Negative for congestion, ear pain and rhinorrhea. Respiratory: Negative for cough and shortness of breath.    Gastrointestinal: Negative for abdominal pain, diarrhea, nausea and vomiting.   Genitourinary: Positive for dysuria, frequency and hematuria. Negative for flank pain and urgency.         Objective:         ? cephalexin (KEFLEX) 500 mg capsule Take one capsule by mouth four times daily for 10 days.     Vitals:    08/01/19 1127   BP: 139/68   Pulse: 81   Resp: 18   Temp: 37.1 ?C (98.7 ?F)   TempSrc: Oral   SpO2: 100%   Weight: 66.9 kg (147 lb 6.4 oz)   Height: 175.3 cm (69)   PainSc: Six     Body mass index is 21.77 kg/m?Marland Kitchen     Physical Exam  Vitals signs and nursing note reviewed.   Constitutional:       General: She is not in acute distress.     Appearance: Normal appearance. She is well-developed. She is not ill-appearing, toxic-appearing or diaphoretic.   HENT:      Head: Normocephalic and atraumatic.      Right Ear: Hearing, tympanic membrane, ear canal and external ear normal.      Left Ear: Hearing, tympanic membrane, ear canal and external  ear normal.      Nose: Nose normal. No rhinorrhea.      Mouth/Throat:      Lips: Pink. No lesions.      Mouth: Mucous membranes are moist.      Pharynx: Pharyngeal swelling, oropharyngeal exudate and posterior oropharyngeal erythema present.      Tonsils: Tonsillar exudate present. 3+ on the right. 3+ on the left.   Eyes:      Pupils: Pupils are equal, round, and reactive to light.   Neck:      Musculoskeletal: Normal range of motion and neck supple.   Cardiovascular:      Rate and Rhythm: Normal rate and regular rhythm.      Heart sounds: Normal heart sounds.   Pulmonary:      Effort: Pulmonary effort is normal.      Breath sounds: Normal breath sounds.   Abdominal:      Tenderness: There is no abdominal tenderness. There is no right CVA tenderness or left CVA tenderness.   Musculoskeletal: Normal range of motion.   Lymphadenopathy:      Cervical: Cervical adenopathy present.   Skin:     General: Skin is warm. Neurological:      Mental Status: She is alert and oriented to person, place, and time.   Psychiatric:         Mood and Affect: Mood normal.         Speech: Speech normal.         Behavior: Behavior normal.         Thought Content: Thought content normal.         Judgment: Judgment normal.              Assessment and Plan:                Kiyo Tiffin was seen today for sore throat and bladder infection.    Diagnoses and all orders for this visit:    Tonsillitis with exudate    Sore throat  -     POC RAPID STREP SCREEN  -     CULTURE-URINE W/SENSITIVITY; Future; Expected date: 08/01/2019  -     POC URINE DIPSTICK AUTO READ  -     CULTURE-RESP,UPPER-THROAT; Future; Expected date: 08/01/2019    Dysuria  -     POC URINE DIPSTICK AUTO READ  -     CULTURE-RESP,UPPER-THROAT; Future; Expected date: 08/01/2019    Urinary tract infection with hematuria, site unspecified    Other orders  -     Discontinue: cephalexin (KEFLEX) 500 mg capsule; Take one capsule by mouth four times daily for 10 days.  -     cephalexin (KEFLEX) 500 mg capsule; Take one capsule by mouth four times daily for 10 days.               Patient Instructions   You were seen today for Urinary pain and sore throat.    Your strep screen is negative, and a culture is pending. We will treat the UTI with an antibiotic that should cover your throat, should it be positive on the culture. If no improvement in the throat by Monday, consider being tested for Mono.     Your urine test was positive for white blood cells and blood.  A culture is pending. We will contact you if we need to switch to a different antibiotic.      You are being prescribed Keflex.    Continue taking  all medication until it is gone.  Return to clinic if fever develops, if pain worsens, or if you begin having nausea/vomiting.        Bladder Infection,?Female (Adult)    A bladder infection (cystitis or UTI) usually causes a constant urge to urinate and a burning when passing urine. Urine may be cloudy, smelly or dark. There may be pain in the lower abdomen. A bladder infection occurs when bacteria from the vaginal area enter the bladder opening (urethra). This can occur from sexual intercourse, wearing tight clothing, dehydration and other factors.  Home Care:  ? Drink lots of fluids (at least 6-8 glasses a day, unless you must restrict fluids for other medical reasons). This will force the medicine into your urinary system and flush the bacteria out of your body.  ? Avoid sexual intercourse until your symptoms are gone.  ? Avoid caffeine, alcohol and spicy foods. These can irritate the bladder.  ? A bladder infection is treated with antibiotics. You may also be given Pyridium (generic = phenazopyridine) to reduce the burning sensation. This medicine will cause your urine to become a bright orange color. The orange urine may stain clothing. You may wear a pad or panty-liner to protect clothing.  Preventing Future Infections:  ? Always wipe from front to back after a bowel movement.  ? Keep the genital area clean and dry.  ? Drink plenty of fluids each day to avoid dehydration.  ? Both sexual partners should wash before intercourse.  ? Urinate right after intercourse to flush out the bladder.  ? Wear cotton underwear and cotton-lined panty hose; avoid tight-fitting pants.  ? If you are on birth control pills and are having frequent bladder infections, discuss with your doctor.  Follow Up:  Return to this facility or see your doctor if ALL symptoms are not gone after three days of treatment.  Get Prompt Medical Attention  if any of the following occur:  ? Fever of 100.4?F (38?C) or higher, or as directed by your healthcare provider  ? No improvement by the third day of treatment  ? Increasing back or abdominal pain  ? Repeated vomiting; unable to keep medicine down  ? Weakness, dizziness or fainting  ? Vaginal discharge ? Pain, redness or swelling in the labia (outer vaginal area)  ? 2000-2014 [employer] [address], [place], [place] [address]. All rights reserved. This information is not intended as a substitute for professional medical care. Always follow your healthcare professional's instructions.

## 2019-08-01 NOTE — Patient Instructions
You were seen today for Urinary pain and sore throat.    Your strep screen is negative, and a culture is pending. We will treat the UTI with an antibiotic that should cover your throat, should it be positive on the culture. If no improvement in the throat by Monday, consider being tested for Mono.     Your urine test was positive for white blood cells and blood.  A culture is pending. We will contact you if we need to switch to a different antibiotic.      You are being prescribed Keflex.    Continue taking all medication until it is gone.  Return to clinic if fever develops, if pain worsens, or if you begin having nausea/vomiting.        Bladder Infection,?Female (Adult)    A bladder infection (cystitis or UTI) usually causes a constant urge to urinate and a burning when passing urine. Urine may be cloudy, smelly or dark. There may be pain in the lower abdomen. A bladder infection occurs when bacteria from the vaginal area enter the bladder opening (urethra). This can occur from sexual intercourse, wearing tight clothing, dehydration and other factors.  Home Care:  ? Drink lots of fluids (at least 6-8 glasses a day, unless you must restrict fluids for other medical reasons). This will force the medicine into your urinary system and flush the bacteria out of your body.  ? Avoid sexual intercourse until your symptoms are gone.  ? Avoid caffeine, alcohol and spicy foods. These can irritate the bladder.  ? A bladder infection is treated with antibiotics. You may also be given Pyridium (generic = phenazopyridine) to reduce the burning sensation. This medicine will cause your urine to become a bright orange color. The orange urine may stain clothing. You may wear a pad or panty-liner to protect clothing.  Preventing Future Infections:  ? Always wipe from front to back after a bowel movement.  ? Keep the genital area clean and dry.  ? Drink plenty of fluids each day to avoid dehydration. ? Both sexual partners should wash before intercourse.  ? Urinate right after intercourse to flush out the bladder.  ? Wear cotton underwear and cotton-lined panty hose; avoid tight-fitting pants.  ? If you are on birth control pills and are having frequent bladder infections, discuss with your doctor.  Follow Up:  Return to this facility or see your doctor if ALL symptoms are not gone after three days of treatment.  Get Prompt Medical Attention  if any of the following occur:  ? Fever of 100.4?F (38?C) or higher, or as directed by your healthcare provider  ? No improvement by the third day of treatment  ? Increasing back or abdominal pain  ? Repeated vomiting; unable to keep medicine down  ? Weakness, dizziness or fainting  ? Vaginal discharge  ? Pain, redness or swelling in the labia (outer vaginal area)  ? 2000-2014 [employer] [address] [place], [place] [address]. All rights reserved. This information is not intended as a substitute for professional medical care. Always follow your healthcare professional's instructions.

## 2019-08-03 ENCOUNTER — Encounter: Admit: 2019-08-03 | Discharge: 2019-08-03 | Payer: BC Managed Care – PPO

## 2019-10-28 ENCOUNTER — Encounter: Admit: 2019-10-28 | Discharge: 2019-10-28 | Payer: BC Managed Care – PPO

## 2019-10-28 ENCOUNTER — Ambulatory Visit: Admit: 2019-10-28 | Discharge: 2019-10-28 | Payer: BC Managed Care – PPO

## 2019-10-28 DIAGNOSIS — Z789 Other specified health status: Secondary | ICD-10-CM

## 2019-10-28 DIAGNOSIS — Z114 Encounter for screening for human immunodeficiency virus [HIV]: Secondary | ICD-10-CM

## 2019-10-28 DIAGNOSIS — Z0000 Encounter for general adult medical examination without abnormal findings: Secondary | ICD-10-CM

## 2019-10-28 DIAGNOSIS — Z23 Encounter for immunization: Secondary | ICD-10-CM

## 2019-10-28 DIAGNOSIS — Z1159 Encounter for screening for other viral diseases: Secondary | ICD-10-CM

## 2019-10-28 DIAGNOSIS — Z3045 Encounter for surveillance of transdermal patch hormonal contraceptive device: Secondary | ICD-10-CM

## 2019-10-28 MED ORDER — NORELGESTROMIN-ETHIN.ESTRADIOL 150-35 MCG/24 HR TD PTWK
1 | MEDICATED_PATCH | TRANSDERMAL | 12 refills | 84.00000 days | Status: AC
Start: 2019-10-28 — End: ?

## 2019-10-28 NOTE — Patient Instructions
You are seen for physical exam today.    Go by lab and have your blood drawn.  If you have not been fasting for the last 8 hours, come back on the morning when you have been.  We will notify you of the results.    You received a TDaP today.    I have refilled your birth control.    We will try to get your PAP smear.    Let us know if you have any issues.    Follow-up in 1 year or as needed.

## 2019-10-28 NOTE — Progress Notes
Date of Service: 10/28/2019    Christine Bush is a 23 y.o. female.  DOB: October 23, 1996  MRN: [id number]     Subjective:    CC:   Chief Complaint   Patient presents with   ? Establish Care     bc patch    ? Physical              History of Present Illness  Physical exam.  No concerns.     Sexually Active: Yes.  No issues.   Sleep: 8+, no issues.   Colon Screen: No symptoms, no previous scope.  No family history  Immunizations:TDaP today.  Has done the HPV before.  Exercise: None.   Diet: Regular.   Smoking: denies.   Drinking:1/week.  Drugs: denies.   Eye exam: Long time ago. Encouraged to go every other year.   Dental exam: Long time ago.  Encouraged to go every 6 months.     Hardships: denies.     Interim Health Changes:  Denies.    SBE: Monthly, no changes  Breast Cancer: denies.    Menstrual: regular  BC: patch.    G0P0    Safe: yes.     Medical History:   Diagnosis Date   ? Patient denies medical problems      History reviewed. No pertinent surgical history.  Family History   Problem Relation Age of Onset   ? Anemia Mother    ? None Reported Father    ? None Reported Sister    ? None Reported Brother    ? None Reported Maternal Grandmother    ? Alzheimer's Maternal Grandfather    ? None Reported Paternal Grandmother    ? None Reported Paternal Grandfather    ? None Reported Sister      Social History     Socioeconomic History   ? Marital status: Single     Spouse name: Not on file   ? Number of children: Not on file   ? Years of education: Not on file   ? Highest education level: Not on file   Occupational History   ? Not on file   Tobacco Use   ? Smoking status: Never Smoker   ? Smokeless tobacco: Never Used   Substance and Sexual Activity   ? Alcohol use: Yes     Frequency: Monthly or less   ? Drug use: Not on file   ? Sexual activity: Not on file   Other Topics Concern   ? Not on file   Social History Narrative   ? Not on file              Review of Systems Constitutional: Negative for chills, diaphoresis, fatigue, fever and unexpected weight change.   HENT: Negative for congestion, ear pain, mouth sores, rhinorrhea, sinus pressure, sinus pain, sore throat and trouble swallowing.    Eyes: Negative for photophobia and visual disturbance.   Respiratory: Negative for cough, shortness of breath, wheezing and stridor.    Cardiovascular: Negative for chest pain, palpitations and leg swelling.   Gastrointestinal: Negative for abdominal distention, abdominal pain, anal bleeding, blood in stool, constipation, diarrhea, nausea, rectal pain and vomiting.   Endocrine: Negative for cold intolerance and heat intolerance.   Genitourinary: Negative for difficulty urinating, dysuria, frequency, genital sores, hematuria, menstrual problem, pelvic pain, urgency, vaginal bleeding, vaginal discharge and vaginal pain.   Musculoskeletal: Negative for arthralgias, back pain, gait problem, joint swelling, myalgias and neck pain.   Skin:  Negative for color change, rash and wound.   Neurological: Negative for dizziness, speech difficulty, weakness, light-headedness and headaches.   Hematological: Negative for adenopathy.   Psychiatric/Behavioral: Negative for dysphoric mood, self-injury and suicidal ideas.         Objective:         ? ethinyl estradiol-norelgestrom (ORTHO EVRA) 150-35 mcg/24 hr transdermal patch Apply one patch to top of skin as directed every 7 days.     Vitals:    10/28/19 0924   BP: 102/60   BP Source: Arm, Right Upper   Patient Position: Sitting   Pulse: 67   Resp: 12   Temp: 36.8 ?C (98.3 ?F)   TempSrc: Temporal   SpO2: 99%   Weight: 67.1 kg (148 lb)   Height: 175.3 cm (69)   PainSc: Zero     Body mass index is 21.86 kg/m?Marland Kitchen     Physical Exam  Vitals signs and nursing note reviewed. Exam conducted with a chaperone present.   Constitutional:       General: She is not in acute distress. Appearance: Normal appearance. She is well-developed. She is not ill-appearing or diaphoretic.   HENT:      Head: Normocephalic and atraumatic.      Right Ear: Hearing, tympanic membrane, ear canal and external ear normal.      Left Ear: Hearing, tympanic membrane, ear canal and external ear normal.      Nose: Nose normal.      Mouth/Throat:      Mouth: Mucous membranes are moist.      Pharynx: Oropharynx is clear. Uvula midline.   Eyes:      General: Lids are normal.      Extraocular Movements: Extraocular movements intact.      Conjunctiva/sclera: Conjunctivae normal.      Pupils: Pupils are equal, round, and reactive to light.   Neck:      Musculoskeletal: Normal range of motion.      Trachea: Trachea normal.   Cardiovascular:      Rate and Rhythm: Normal rate and regular rhythm.      Heart sounds: Normal heart sounds. No murmur. No friction rub. No gallop.    Pulmonary:      Effort: Pulmonary effort is normal. No respiratory distress.      Breath sounds: Normal breath sounds. No decreased breath sounds, wheezing, rhonchi or rales.   Chest:      Breasts: Breasts are symmetrical.         Right: Normal.         Left: Normal.   Abdominal:      General: Bowel sounds are normal.      Palpations: Abdomen is soft. Abdomen is not rigid.      Tenderness: There is no abdominal tenderness. There is no guarding or rebound. Negative signs include Murphy's sign and McBurney's sign.   Musculoskeletal: Normal range of motion.   Lymphadenopathy:      Upper Body:      Right upper body: No supraclavicular, axillary or pectoral adenopathy.      Left upper body: No supraclavicular, axillary or pectoral adenopathy.   Skin:     General: Skin is warm and dry.      Capillary Refill: Capillary refill takes less than 2 seconds.      Coloration: Skin is not pale.      Findings: No erythema or rash.   Neurological:      General: No focal deficit present.  Mental Status: She is alert and oriented to person, place, and time. GCS: GCS eye subscore is 4. GCS verbal subscore is 5. GCS motor subscore is 6.      Cranial Nerves: No cranial nerve deficit.      Sensory: No sensory deficit.      Coordination: Coordination normal.      Gait: Gait normal.      Deep Tendon Reflexes:      Reflex Scores:       Patellar reflexes are 2+ on the right side and 2+ on the left side.  Psychiatric:         Mood and Affect: Mood normal.         Speech: Speech normal.         Behavior: Behavior normal.         Depression:  Patient Scores:  PHQ-2: PHQ-2 Score: 0 (10/28/2019  9:26 AM)    PHQ-9: No data recorded  Interventions:  PHQ-2: PHQ-2 Score less than 3: No follow-up or recommendations are necessary at this time (10/28/2019  9:26 AM)    Depression Interventions PHQ-2/9: No data recorded    BMI:  Body mass index is 21.86 kg/m?Marland Kitchen  No data recorded    Falls:  Fall History (last 68mo): No Falls (10/28/2019  9:26 AM)  Fall Risk: None identified (10/28/2019  9:26 AM)         Assessment and Plan:  Christine Bush was seen today for establish care and physical.    Diagnoses and all orders for this visit:    Physical exam  -     CBC AND DIFF; Future; Expected date: 10/28/2019  -     COMPREHENSIVE METABOLIC PANEL; Future; Expected date: 10/28/2019  -     LIPID PROFILE; Future; Expected date: 10/28/2019    Encounter for screening for HIV  -     HIV 1& 2 AG-AB SCRN W REFLEX HIV 1 PCR QUANT; Future; Expected date: 10/28/2019    Need for hepatitis C screening test  -     HEPATITIS C ANTIBODY W REFLEX HCV PCR QUANT; Future; Expected date: 10/28/2019    Need for Tdap vaccination  -     TDAP VACCINE >7YO IM    Encounter for surveillance of transdermal patch hormonal contraceptive device  -     ethinyl estradiol-norelgestrom (ORTHO EVRA) 150-35 mcg/24 hr transdermal patch; Apply one patch to top of skin as directed every 7 days.                             Patient Instructions   You are seen for physical exam today. Go by lab and have your blood drawn.  If you have not been fasting for the last 8 hours, come back on the morning when you have been.  We will notify you of the results.    You received a TDaP today.    I have refilled your birth control.    We will try to get your PAP smear.    Let us know if you have any issues.    Follow-up in 1 year or as needed.      I reviewed with the patient their current medications and specifically any new medications prescribed at the time of this visit and we reviewed the expected benefits and potential side effects. All questions are answered to the patient's satisfaction.  No follow-ups on file.

## 2020-01-19 ENCOUNTER — Encounter: Admit: 2020-01-19 | Discharge: 2020-01-19 | Payer: BC Managed Care – PPO

## 2020-01-19 NOTE — Telephone Encounter
Called pt to ask if pap smear has been completed. Pt states pap smear was completed at the [facility] at [facility]. Records requested.

## 2020-01-20 ENCOUNTER — Encounter: Admit: 2020-01-20 | Discharge: 2020-01-20 | Payer: BC Managed Care – PPO

## 2020-04-29 ENCOUNTER — Encounter: Admit: 2020-04-29 | Discharge: 2020-04-29 | Payer: BC Managed Care – PPO

## 2020-05-11 ENCOUNTER — Encounter: Admit: 2020-05-11 | Discharge: 2020-05-11 | Payer: BC Managed Care – PPO

## 2020-05-11 DIAGNOSIS — Z124 Encounter for screening for malignant neoplasm of cervix: Secondary | ICD-10-CM

## 2020-07-29 ENCOUNTER — Encounter: Admit: 2020-07-29 | Discharge: 2020-07-29 | Payer: BC Managed Care – PPO

## 2020-08-17 ENCOUNTER — Encounter: Admit: 2020-08-17 | Discharge: 2020-08-17 | Payer: BC Managed Care – PPO

## 2020-09-06 ENCOUNTER — Encounter: Admit: 2020-09-06 | Discharge: 2020-09-06 | Payer: BC Managed Care – PPO

## 2020-09-21 ENCOUNTER — Encounter: Admit: 2020-09-21 | Discharge: 2020-09-21 | Payer: BC Managed Care – PPO

## 2020-09-22 ENCOUNTER — Encounter: Admit: 2020-09-22 | Discharge: 2020-09-22 | Payer: BC Managed Care – PPO

## 2020-09-22 ENCOUNTER — Ambulatory Visit: Admit: 2020-09-22 | Discharge: 2020-09-22 | Payer: BC Managed Care – PPO

## 2020-09-22 DIAGNOSIS — Z0000 Encounter for general adult medical examination without abnormal findings: Secondary | ICD-10-CM

## 2020-09-22 DIAGNOSIS — Z23 Encounter for immunization: Secondary | ICD-10-CM

## 2020-09-22 DIAGNOSIS — Z1159 Encounter for screening for other viral diseases: Secondary | ICD-10-CM

## 2020-09-22 DIAGNOSIS — Z113 Encounter for screening for infections with a predominantly sexual mode of transmission: Principal | ICD-10-CM

## 2020-09-22 DIAGNOSIS — Z789 Other specified health status: Secondary | ICD-10-CM

## 2020-09-22 DIAGNOSIS — Z3045 Encounter for surveillance of transdermal patch hormonal contraceptive device: Secondary | ICD-10-CM

## 2020-09-22 LAB — DIRECT EXAM (WET PREP)

## 2020-09-22 MED ORDER — NORELGESTROMIN-ETHIN.ESTRADIOL 150-35 MCG/24 HR TD PTWK
1 | MEDICATED_PATCH | TRANSDERMAL | 12 refills | 84.00000 days | Status: AC
Start: 2020-09-22 — End: ?

## 2020-09-22 NOTE — Progress Notes
Date of Service: 09/22/2020    Christine Bush is a 24 y.o. female.  DOB: 10-02-1996  MRN: [id number]     Subjective:    CC:   Chief Complaint   Patient presents with   ? Physical              History of Present Illness  Patient presents the clinic today for physical exam and to get refills on her birth control, as well as to get the HPV and Covid vaccine.  She did have some discomfort with urination a week or so ago that has since resolved.  However, she would appreciate being screened for STDs at this time.    Sexually Active: Yes.  No issues. Discomfort a week ago.  Sleep: 8+, no issues.   Colon Screen: No symptoms, no previous scope.  No family history  Immunizations: COVID and HPV today  Exercise: Three times a week gym, cardio and resistance.   Diet: Regular.   Smoking: denies.   Drinking:3/week.  Drugs: denies.   Eye exam: every other year.   Dental exam: Is going to make an appointment.    ?  Hardships: denies.   ?  Interim Health Changes:  Denies.  ?  SBE: Monthly, no changes  Breast Cancer: denies.  ?  Menstrual: regular  BC: patch.  ?  G0P0  ?  Safe: yes.   ?    Medical History:   Diagnosis Date   ? Patient denies medical problems      No past surgical history on file.  Family History   Problem Relation Age of Onset   ? Anemia Mother    ? None Reported Father    ? None Reported Sister    ? None Reported Brother    ? None Reported Maternal Grandmother    ? Alzheimer's Maternal Grandfather    ? None Reported Paternal Grandmother    ? None Reported Paternal Grandfather    ? None Reported Sister      Social History     Socioeconomic History   ? Marital status: Single     Spouse name: Not on file   ? Number of children: Not on file   ? Years of education: Not on file   ? Highest education level: Not on file   Occupational History   ? Not on file   Tobacco Use   ? Smoking status: Never Smoker   ? Smokeless tobacco: Never Used   Substance and Sexual Activity   ? Alcohol use: Yes   ? Drug use: Not on file   ? Sexual activity: Not on file   Other Topics Concern   ? Not on file   Social History Narrative   ? Not on file              Review of Systems   Constitutional: Negative for chills, diaphoresis, fatigue, fever and unexpected weight change.   HENT: Negative for congestion, ear pain, mouth sores, rhinorrhea, sinus pressure, sinus pain, sore throat and trouble swallowing.    Eyes: Negative for photophobia and visual disturbance.   Respiratory: Negative for cough, shortness of breath, wheezing and stridor.    Cardiovascular: Negative for chest pain, palpitations and leg swelling.   Gastrointestinal: Negative for abdominal distention, abdominal pain, anal bleeding, blood in stool, constipation, diarrhea, nausea, rectal pain and vomiting.   Endocrine: Negative for cold intolerance and heat intolerance.   Genitourinary: Negative for difficulty urinating, dysuria, frequency, genital sores, hematuria,  menstrual problem, pelvic pain, urgency, vaginal bleeding, vaginal discharge and vaginal pain.   Musculoskeletal: Negative for arthralgias, back pain, gait problem, joint swelling, myalgias and neck pain.   Skin: Negative for color change, rash and wound.   Neurological: Negative for dizziness, speech difficulty, weakness, light-headedness and headaches.   Hematological: Negative for adenopathy.   Psychiatric/Behavioral: Negative for dysphoric mood, self-injury and suicidal ideas.         Objective:         ? ethinyl estradiol-norelgestrom (ORTHO EVRA) 150-35 mcg/24 hr transdermal patch Apply one patch to top of skin as directed every 7 days.     Vitals:    09/22/20 0932   BP: 106/64   Pulse: 77   Resp: 16   Temp: 36.2 ?C (97.2 ?F)   SpO2: 100%   Weight: 69.6 kg (153 lb 6.4 oz)   Height: 175.3 cm (69)   PainSc: Zero     Body mass index is 22.65 kg/m?Marland Kitchen     Physical Exam  Vitals and nursing note reviewed. Exam conducted with a chaperone present.   Constitutional:       General: She is not in acute distress.     Appearance: Normal appearance. She is well-developed. She is not ill-appearing or diaphoretic.   HENT:      Head: Normocephalic and atraumatic.      Right Ear: Hearing, tympanic membrane, ear canal and external ear normal.      Left Ear: Hearing, tympanic membrane, ear canal and external ear normal.      Nose: Nose normal.      Mouth/Throat:      Mouth: Mucous membranes are moist.      Pharynx: Oropharynx is clear. Uvula midline.   Eyes:      General: Lids are normal.      Conjunctiva/sclera: Conjunctivae normal.      Pupils: Pupils are equal, round, and reactive to light.   Neck:      Trachea: Trachea normal.   Cardiovascular:      Rate and Rhythm: Normal rate and regular rhythm.      Heart sounds: Normal heart sounds. No murmur heard.   No friction rub. No gallop.    Pulmonary:      Effort: Pulmonary effort is normal. No respiratory distress.      Breath sounds: Normal breath sounds. No decreased breath sounds, wheezing, rhonchi or rales.   Chest:      Breasts: Tanner Score is 5.         Right: Inverted nipple and mass present. No swelling, bleeding, nipple discharge, skin change or tenderness.         Left: Normal.       Abdominal:      General: Abdomen is flat. Bowel sounds are normal.      Palpations: Abdomen is soft. Abdomen is not rigid.      Tenderness: There is no abdominal tenderness. There is no guarding or rebound. Negative signs include Murphy's sign and McBurney's sign.      Hernia: There is no hernia in the left inguinal area or right inguinal area.   Genitourinary:     General: Normal vulva.      Exam position: Supine.      Tanner stage (genital): 5.      Vagina: Normal.      Cervix: Normal.      Rectum: Normal.   Musculoskeletal:         General: Normal range of motion.  Cervical back: Normal range of motion.   Lymphadenopathy:      Upper Body:      Right upper body: No supraclavicular, axillary or pectoral adenopathy.      Left upper body: No supraclavicular, axillary or pectoral adenopathy.      Lower Body: No right inguinal adenopathy. No left inguinal adenopathy.   Skin:     General: Skin is warm and dry.      Coloration: Skin is not pale.      Findings: No erythema or rash.   Neurological:      Mental Status: She is alert and oriented to person, place, and time.      GCS: GCS eye subscore is 4. GCS verbal subscore is 5. GCS motor subscore is 6.      Cranial Nerves: No cranial nerve deficit.      Sensory: No sensory deficit.      Coordination: Coordination normal.      Gait: Gait normal.      Deep Tendon Reflexes:      Reflex Scores:       Patellar reflexes are 2+ on the right side and 2+ on the left side.  Psychiatric:         Speech: Speech normal.         Behavior: Behavior normal.         Depression:  Patient Scores:  PHQ-2: PHQ-2 Score: 0 (09/22/2020  9:33 AM)    PHQ-9: No data recorded  Interventions:  PHQ-2: PHQ-2 Score less than 3: No follow-up or recommendations are necessary at this time (09/22/2020  9:33 AM)    Depression Interventions PHQ-2/9: No data recorded    BMI:  Body mass index is 22.65 kg/m?Marland Kitchen  No data recorded    Falls:  Fall History (last 92mo): No Falls (09/22/2020  9:33 AM)  Fall Risk: None identified (10/28/2019  9:26 AM)         Assessment and Plan:  Parlie Omans was seen today for physical.    Diagnoses and all orders for this visit:    Physical exam  -     COMPREHENSIVE METABOLIC PANEL; Future; Expected date: 09/22/2020  -     LIPID PROFILE; Future; Expected date: 09/22/2020  -     CBC AND DIFF; Future; Expected date: 09/22/2020    Need for HPV vaccination  -     HPV VACCINE 9 VALENT IM (GARDASIL 9)    Screen for STD (sexually transmitted disease)  -     CHLAM/NG PCR SWAB; Future; Expected date: 09/22/2020  -     DIRECT EXAM (WET PREP); Future; Expected date: 09/22/2020    Need for COVID-19 vaccine  -     COVID-19 (PFIZER), MRNA, LNP-S, 30 MCG/0.3 ML (PF)    Need for hepatitis C screening test  -     HEPATITIS C ANTIBODY W REFLEX HCV PCR QUANT; Future; Expected date: 09/22/2020    Encounter for surveillance of transdermal patch hormonal contraceptive device  -     ethinyl estradiol-norelgestrom (ORTHO EVRA) 150-35 mcg/24 hr transdermal patch; Apply one patch to top of skin as directed every 7 days.                             Patient Instructions   You are seen for physical exam today.    Go by lab and have your blood drawn.  If you have not been fasting for the  last 8 hours, come back on the morning when you have been.  We will notify you of the results.    I refilled your birth control today.    We did do an STD screen on you today.    We will notify you of the results.    Get your labs drawn in December.  The lab is open at 8:00 every morning Monday through Friday.    Let us know if you have any issues.    Follow-up in 1 year or as needed    You received your HPV and Covid vaccine today.        I reviewed with the patient their current medications and specifically any new medications prescribed at the time of this visit and we reviewed the expected benefits and potential side effects. All questions are answered to the patient's satisfaction.  No follow-ups on file.

## 2020-09-22 NOTE — Patient Instructions
You are seen for physical exam today.    Go by lab and have your blood drawn.  If you have not been fasting for the last 8 hours, come back on the morning when you have been.  We will notify you of the results.    I refilled your birth control today.    We did do an STD screen on you today.    We will notify you of the results.    Get your labs drawn in December.  The lab is open at 8:00 every morning Monday through Friday.    Let us know if you have any issues.    Follow-up in 1 year or as needed    You received your HPV and Covid vaccine today.

## 2020-10-13 ENCOUNTER — Encounter: Admit: 2020-10-13 | Discharge: 2020-10-13 | Payer: BC Managed Care – PPO

## 2020-10-13 ENCOUNTER — Ambulatory Visit: Admit: 2020-10-13 | Discharge: 2020-10-13 | Payer: BC Managed Care – PPO

## 2020-10-13 DIAGNOSIS — Z23 Encounter for immunization: Secondary | ICD-10-CM

## 2021-02-15 ENCOUNTER — Encounter: Admit: 2021-02-15 | Discharge: 2021-02-15 | Payer: BC Managed Care – PPO

## 2021-02-15 NOTE — Telephone Encounter
Message left on voice mail cell phone to call back. She had left message but unable to understand it due to message breaking up. Will await her return call.
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
## 2021-03-01 ENCOUNTER — Encounter: Admit: 2021-03-01 | Discharge: 2021-03-01 | Payer: BC Managed Care – PPO

## 2021-08-03 ENCOUNTER — Encounter: Admit: 2021-08-03 | Discharge: 2021-08-03 | Payer: BC Managed Care – PPO

## 2021-09-11 ENCOUNTER — Encounter: Admit: 2021-09-11 | Discharge: 2021-09-11 | Payer: BC Managed Care – PPO

## 2021-09-11 DIAGNOSIS — Z3045 Encounter for surveillance of transdermal patch hormonal contraceptive device: Secondary | ICD-10-CM

## 2021-09-11 MED ORDER — XULANE 150-35 MCG/24 HR TD PTWK
MEDICATED_PATCH | 4 refills
Start: 2021-09-11 — End: ?

## 2021-09-12 ENCOUNTER — Encounter: Admit: 2021-09-12 | Discharge: 2021-09-12 | Payer: BC Managed Care – PPO

## 2021-09-22 ENCOUNTER — Encounter: Admit: 2021-09-22 | Discharge: 2021-09-22 | Payer: BC Managed Care – PPO

## 2022-01-09 ENCOUNTER — Encounter: Admit: 2022-01-09 | Discharge: 2022-01-09 | Payer: BC Managed Care – PPO

## 2022-01-12 ENCOUNTER — Ambulatory Visit: Admit: 2022-01-12 | Discharge: 2022-01-13 | Payer: BC Managed Care – PPO

## 2022-01-12 ENCOUNTER — Encounter: Admit: 2022-01-12 | Discharge: 2022-01-12 | Payer: BC Managed Care – PPO

## 2022-01-12 VITALS — BP 118/66 | HR 77 | Temp 98.10000°F | Resp 18 | Ht 69.0 in | Wt 152.3 lb

## 2022-01-12 DIAGNOSIS — Z789 Other specified health status: Secondary | ICD-10-CM

## 2022-01-12 NOTE — Progress Notes
Date of Service: 01/12/2022     Subjective:    History of Present Illness  Christine Bush is a 26 y.o. female in clinic to establish care, have annual physical exam.  She is originally from [place].  Went to [employer], studying health sciences.  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  She enjoys hot yoga, goes to the gym every so often.  She lives in a studio apartment by herself.  She has a boyfriend, is monogamous with him sexually, has been with multiple sexual partners in the past few years.  No symptoms of STIs, is okay with screening for various things today.  She has not had a Pap smear since 10/2018, this was unremarkable.  She has no concerns about her health.  Does need TB screening prior to starting nursing school.    Review of Systems    Objective:         ? ethinyl estradiol-norelgestrom (ORTHO EVRA) 150-35 mcg/24 hr transdermal patch Apply one patch to top of skin as directed every 7 days.     Vitals:    01/12/22 1111   BP: 118/66   Pulse: 77   Temp: 36.7 ?C (98.1 ?F)   Resp: 18   SpO2: 99%   TempSrc: Skin   PainSc: Zero   Weight: 69.1 kg (152 lb 4.8 oz)   Height: 175.3 cm (5' 9)     Body mass index is 22.49 kg/m?Marland Kitchen   Physical Exam  GEN: no acute distress; WN/WD, calm, conversive, cooperative  NEURO: A&OX4  HEENT: atraumatic, normocephalic; PERRL with 4mm pupils and EOMI; nonicteric, noninjected sclera, pink conjunctiva b/l; no excoriations or otic discharge noted b/l, TM clear, no bulging, landmarks visible; nose midline without evidence of trauma, nasal mucosa pink, non-engorged, minimal secretions without purulence; oropharyngeal mucosa moist without visualized erythema, exudates, cobblestoning/streaking or ulceration; dentition in tact  CV: RRR with normal S1/2 without audible S3/4 or murmurs, gallops, clicks, rubs; peripheral pulses equal, 2+/4 b/l  PULM: CTAB, no wheezes, rhonchi, or crackles; symmetrical chest expansion with non-labored breathing  ABD: soft, non-distended, normoactive BSX4; no guarding, rebound, or TTP; no hepatosplenomegaly or masses palpated  INTEGUMENT: no rash; warm, pink in color, no cyanosis; good turgor; no skin breakdown  PSYCH: appropriate affect    Assessment and Plan:  1. Annual physical exam  26 year old female with the below health maintenance care gaps addressed today  - Encouraged her that whether it is with me or another provider she should have cervical cancer screening performed once every 3 years unless there are abnormal results, until she turns 31 in which case she can combine her cytology with HPV cotesting, and stretch the interval to every 5 years  - FLU VACCINE =>6 MONTHS QUADRIVALENT PF  - POC TB SKIN TEST  - LIPID PROFILE; Future  - HIV 1 & 2 AG-AB SCRN W REFLEX TO HIV CONFIRMATION; Future  - HEPATITIS C ANTIBODY W REFLEX HCV PCR QUANT; Future  - CHLAM/NG PCR URINE; Future  - HPV VACCINE 9 VALENT IM (GARDASIL 9)    Return in about 1 year (around 01/13/2023) for In-Person.    Patient Instructions   If you are signed up for MyChart (a secure internal messaging system), you can message me with questions or concerns. If you need assistance signing up for MyChart, please contact us.    For follow-up appointments, please call the appointment line  [phone] and specify at which location you would like to be seen. If you need to fax something, please use [phone].    For questions regarding care, please call my nurse Thea Silversmith, RN at [phone].    No future appointments.    [facility] - Primary Care (Monday & Thursday 8-4:30)  [address]  [place], [place] [address]    [facility] - Primary Care [phone] & Friday 1-4:30)  [address] [place], [address]    [facility] - Specialist [phone] 8-4:30 & Friday 8-11:30)  [address]  [place], [place] [address]    Chilton Greathouse, DO, [facility]  Family Medicine & Sports Medicine  [facility] Team Physician

## 2022-01-12 NOTE — Patient Instructions
If you are signed up for MyChart (a secure internal messaging system), you can message me with questions or concerns. If you need assistance signing up for MyChart, please contact us.    For follow-up appointments, please call the appointment line [phone] and specify at which location you would like to be seen. If you need to fax something, please use [phone].    For questions regarding care, please call my nurse Mackenzie, RN at [phone].    No future appointments.    [facility] - Primary Care (Monday & Thursday 8-4:30)  [address]  [place], [place] [address]    [facility] - Primary Care (Tuesday & Friday 1-4:30)  [address]  [place], [address]    [facility] - Specialist (Wednesday 8-4:30 & Friday 8-11:30)  [address]  [place],  [address]    Rayleigh Gillyard, DO, CAQSM  Family Medicine & Sports Medicine  [facility] Team Physician

## 2022-01-13 DIAGNOSIS — Z0000 Encounter for general adult medical examination without abnormal findings: Principal | ICD-10-CM

## 2022-01-25 ENCOUNTER — Encounter: Admit: 2022-01-25 | Discharge: 2022-01-25 | Payer: BC Managed Care – PPO

## 2022-01-25 DIAGNOSIS — Z0000 Encounter for general adult medical examination without abnormal findings: Secondary | ICD-10-CM

## 2022-01-31 ENCOUNTER — Encounter: Admit: 2022-01-31 | Discharge: 2022-01-31 | Payer: BC Managed Care – PPO

## 2022-02-01 ENCOUNTER — Ambulatory Visit: Admit: 2022-02-01 | Discharge: 2022-02-01 | Payer: BC Managed Care – PPO

## 2022-02-01 ENCOUNTER — Encounter: Admit: 2022-02-01 | Discharge: 2022-02-01 | Payer: BC Managed Care – PPO

## 2022-02-01 DIAGNOSIS — Z0000 Encounter for general adult medical examination without abnormal findings: Secondary | ICD-10-CM

## 2022-02-01 LAB — LIPID PROFILE
CHOLESTEROL: 165 mg/dL (ref <200)
LDL: 84 mg/dL (ref <100)
NON HDL CHOLESTEROL: 92 mg/dL
TRIGLYCERIDES: 67 mg/dL (ref <150)

## 2022-02-01 LAB — HIV 1 & 2 AG-AB SCRN W REFLEX TO HIV CONFIRMATION

## 2022-02-01 LAB — HEPATITIS C ANTIBODY W REFLEX HCV PCR QUANT

## 2022-02-01 LAB — CHLAM/NG PCR URINE
CHLAMYDIA TRACH PCR: NEGATIVE
NEISSERIA GONORROEAE PCR: NEGATIVE K/UL (ref 4.5–11.0)

## 2022-02-03 ENCOUNTER — Encounter: Admit: 2022-02-03 | Discharge: 2022-02-03 | Payer: BC Managed Care – PPO

## 2022-02-03 NOTE — Telephone Encounter
Patient was in for her TB read and inquired about her Hepatitis B titers that were drawn and requested to know what the recommendations are for her.     Philipp Deputy, RN

## 2022-02-03 NOTE — Telephone Encounter
Spoke with patient on phone about HEP B results and asked what her program requirement date was. Pt states 02/11/22 due date. Patient is going to call and see what they would like, either to just start them up or to repeat in a month as the results recommend. Gave patient my desk number to call back once she hears back from them.

## 2022-02-15 ENCOUNTER — Encounter: Admit: 2022-02-15 | Discharge: 2022-02-15 | Payer: BC Managed Care – PPO

## 2022-02-15 ENCOUNTER — Ambulatory Visit: Admit: 2022-02-15 | Discharge: 2022-02-16 | Payer: BC Managed Care – PPO

## 2022-02-15 NOTE — Progress Notes
Patient presents to clinic for  2nd TB test. 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 This RN performed TB injection on left arm. Patient tolerated injection. This RN advised patient to return within 48-72 hours for [place] to read. Patient voiced understanding and will come in on Friday at 11am.

## 2022-02-16 DIAGNOSIS — Z111 Encounter for screening for respiratory tuberculosis: Secondary | ICD-10-CM

## 2022-03-03 ENCOUNTER — Encounter: Admit: 2022-03-03 | Discharge: 2022-03-03 | Payer: BC Managed Care – PPO

## 2022-03-03 DIAGNOSIS — Z113 Encounter for screening for infections with a predominantly sexual mode of transmission: Secondary | ICD-10-CM

## 2022-03-03 NOTE — Telephone Encounter
Okay to obtain labs for STI screening. Will evaluate her fatigue at upcoming appointment.

## 2022-03-03 NOTE — Telephone Encounter
Called patient back to schedule appointment to discuss possible causes/ be evaluated for the fatigue by provider. Added to next Tuesday 03/07/22 schedule at 4:15.

## 2022-03-30 ENCOUNTER — Encounter: Admit: 2022-03-30 | Discharge: 2022-03-30 | Payer: BC Managed Care – PPO

## 2022-03-30 ENCOUNTER — Ambulatory Visit: Admit: 2022-03-30 | Discharge: 2022-03-30 | Payer: BC Managed Care – PPO

## 2022-03-30 DIAGNOSIS — Z113 Encounter for screening for infections with a predominantly sexual mode of transmission: Secondary | ICD-10-CM

## 2022-03-30 LAB — HEPATITIS B SURFACE AB: HEP B SURFACE ABY: NEGATIVE

## 2022-03-30 LAB — HIV 1 & 2 AG-AB SCRN W REFLEX TO HIV CONFIRMATION

## 2022-03-30 LAB — HEPATITIS B SURFACE AG

## 2022-03-30 LAB — HEPATITIS C ANTIBODY W REFLEX HCV PCR QUANT

## 2022-03-30 LAB — SYPHILIS AB SCREEN: SYPHILIS AB, TOTAL: NEGATIVE

## 2022-06-28 ENCOUNTER — Encounter: Admit: 2022-06-28 | Discharge: 2022-06-28 | Payer: BC Managed Care – PPO

## 2022-08-22 ENCOUNTER — Encounter: Admit: 2022-08-22 | Discharge: 2022-08-22 | Payer: BC Managed Care – PPO

## 2022-10-11 ENCOUNTER — Encounter: Admit: 2022-10-11 | Discharge: 2022-10-11 | Payer: BC Managed Care – PPO

## 2022-11-22 ENCOUNTER — Encounter: Admit: 2022-11-22 | Discharge: 2022-11-22 | Payer: BC Managed Care – PPO

## 2023-01-02 ENCOUNTER — Encounter: Admit: 2023-01-02 | Discharge: 2023-01-02 | Payer: BC Managed Care – PPO

## 2023-01-03 ENCOUNTER — Encounter: Admit: 2023-01-03 | Discharge: 2023-01-03 | Payer: BC Managed Care – PPO

## 2023-01-03 DIAGNOSIS — Z0000 Encounter for general adult medical examination without abnormal findings: Secondary | ICD-10-CM

## 2023-01-08 ENCOUNTER — Ambulatory Visit: Admit: 2023-01-08 | Discharge: 2023-01-08 | Payer: BC Managed Care – PPO

## 2023-01-08 ENCOUNTER — Encounter: Admit: 2023-01-08 | Discharge: 2023-01-08 | Payer: BC Managed Care – PPO

## 2023-01-08 DIAGNOSIS — Z0000 Encounter for general adult medical examination without abnormal findings: Secondary | ICD-10-CM

## 2023-01-08 LAB — SYPHILIS AB SCREEN: SYPHILIS AB, TOTAL: NEGATIVE

## 2023-01-08 LAB — CHLAM/NG PCR URINE
CHLAMYDIA TRACH PCR: NEGATIVE
NEISSERIA GONORROEAE PCR: NEGATIVE

## 2023-01-08 LAB — HIV 1 & 2 AG-AB SCRN W REFLEX TO HIV CONFIRMATION

## 2023-01-08 LAB — HEPATITIS C ANTIBODY W REFLEX HCV PCR QUANT

## 2023-01-08 LAB — HEPATITIS B SURFACE AG

## 2023-06-18 ENCOUNTER — Ambulatory Visit: Admit: 2023-06-18 | Discharge: 2023-06-19 | Payer: BC Managed Care – PPO

## 2023-06-18 ENCOUNTER — Encounter: Admit: 2023-06-18 | Discharge: 2023-06-18 | Payer: BC Managed Care – PPO

## 2023-06-18 DIAGNOSIS — Z789 Other specified health status: Secondary | ICD-10-CM

## 2023-06-18 DIAGNOSIS — N3090 Cystitis, unspecified without hematuria: Secondary | ICD-10-CM

## 2023-06-18 DIAGNOSIS — R300 Dysuria: Secondary | ICD-10-CM

## 2023-06-18 MED ORDER — SULFAMETHOXAZOLE-TRIMETHOPRIM 800-160 MG PO TAB
1 | ORAL_TABLET | Freq: Two times a day (BID) | ORAL | 0 refills | Status: AC
Start: 2023-06-18 — End: ?

## 2023-06-18 NOTE — Progress Notes
HPI:  Urinary Pain (Since Saturday, pain with urination, blood with urination, pressure, and )     Patient arrives at the urgent care because she states since Saturday she has had symptoms of UTI.  Patient states she has has a history of UTIs but this 1 is much worse.  Patient states that she has frequency, urgency, decreased urine, hematuria, lower abdominal pain, but denies any flank pain.  Patient states she is not taking any medications that improve her symptoms.  Patient is 27, alert, answering appropriately, ambulating without difficulty, does not appear to be in any acute distress      Review of Systems   Constitutional: Negative.    HENT: Negative.     Eyes: Negative.    Respiratory: Negative.     Cardiovascular: Negative.    Gastrointestinal:  Positive for abdominal pain.   Genitourinary:  Positive for decreased urine volume, dysuria, frequency, hematuria and urgency. Negative for flank pain, pelvic pain, vaginal bleeding, vaginal discharge and vaginal pain.   Musculoskeletal: Negative.    Skin: Negative.    Neurological: Negative.         Physical Exam  Vitals reviewed.   Constitutional:       General: She is not in acute distress.     Appearance: Normal appearance. She is not ill-appearing.   HENT:      Head: Normocephalic and atraumatic.      Nose: Nose normal.   Eyes:      Extraocular Movements: Extraocular movements intact.   Cardiovascular:      Rate and Rhythm: Normal rate and regular rhythm.      Pulses: Normal pulses.      Heart sounds: Normal heart sounds.   Pulmonary:      Effort: Pulmonary effort is normal.      Breath sounds: Normal breath sounds.   Musculoskeletal:         General: Normal range of motion.      Cervical back: Normal range of motion.   Skin:     General: Skin is warm and dry.   Neurological:      General: No focal deficit present.      Mental Status: She is alert and oriented to person, place, and time.          Vitals:    06/18/23 1324   BP: 123/77   Pulse: 71   Temp: 36.8 ?C (98.3 ?F)   Resp: 18   SpO2: 99%          ethinyl estradiol-norelgestrom (ORTHO EVRA) 150-35 mcg/24 hr transdermal patch Apply one patch to top of skin as directed every 7 days.       Past Medical History:   Diagnosis Date    Patient denies medical problems         Results for orders placed or performed in visit on 06/18/23 (from the past 336 hour(s))   POC URINE PREGNANCY   Result Value Ref Range    Urine Pregnancy POC Negative     POC Urine Pregnancy Quality Control Pass     Lot Number [id number]    POC URINE DIPSTICK AUTO READ   Result Value Ref Range    Urine Glucose POC Trace (A) Negative    Urine Bilirubin POC Negative Negative    Urine Ketone POC Negative Negative    Urine Specific Gravity POC 1.025 1.003 - 1.035    Urine Blood POC 2+ (A) Negative    Urine PH POC 5.5  5 - 8    Urine Protein POC 1+ (A) Negative    Urine Urobilinogen POC 0.2 0.2, 1.0    Urine Nitrite POC Positive (A) Negative    Urine Leukocytes POC 1+ (A) Negative    Urine Color POC Orange (A) Yellow, Colorless, Pale Yellow, Amber    Urine Turbidity POC Cloudy (A) Clear           Assessment/Plan:  Nieves Hicks was seen today for urinary pain.    Diagnoses and all orders for this visit:    Dysuria  -     POC URINE PREGNANCY  -     CULTURE-URINE W/SENSITIVITY; Future; Expected date: 06/18/2023  -     POC URINE DIPSTICK AUTO READ    Cystitis          Patient Instructions   Take the antibiotic as prescribed for the full amount of days until completed.  Drink 60 to 100 ounces of water a day for hydration.  If you start spiking any fevers or having severe kidney pain you need to go directly to an ER for further evaluation and possible IV treatment  Always discuss with your pharmacist any new medications that you take to make sure that they do not have any interactions with any current medications that you are taking.  If symptoms fail to improve, follow-up with your PCP  If symptoms worsen please get evaluated in the emergency room for further evaluation and treatment  If you have had any testing done you will only be notified of positive results.    All testing results will show in your My Chart portal if you have signed up for it.    Bladder Infection, Female (Adult)  Urine normally doesn't have any germs (bacteria) in it. But bacteria can get into the urinary tract from the skin around the rectum. Or they can travel in the blood from other parts of the body. Once they are in your urinary tract, they can cause infection in these areas:   The urethra (urethritis)  The bladder (cystitis)  The kidneys (pyelonephritis)  The most common place for an infection is in the bladder. This is called a bladder infection. This is one of the most common infections in women because women have a shorter urethra than men. Bacteria have a shorter distance to travel to reach the bladder.. Women who have gone through menopause also lose the protection from estrogen that lowers the chance of getting a UTI. And some women are at higher risk because of their genes.   Most bladder infections are easily treated. They are not serious unless the infection spreads to the kidney.   The terms bladder infection, UTI, and cystitis are often used to describe the same thing. But they are not always the same. Cystitis is an inflammation of the bladder. The most common cause of cystitis is an infection.     Symptoms  The infection causes inflammation in the urethra and bladder. This causes many of the symptoms. The most common symptoms of a bladder infection are:   Pain or burning when urinating  Having to urinate more often than normal  Urgent need to urinate  Only a small amount of urine comes out  Blood in urine  Belly (abdominal) discomfort. This is often in the lower belly above the pubic bone.  Lower back pain  Cloudy urine  Strong- or bad-smelling urine  Unable to urinate (urinary retention)  Unable to hold urine in (urinary incontinence)  Fever  Loss of appetite  Confusion (in older adults)  Causes  Bladder infections are not contagious. You can't get one from someone else, from a toilet seat, or from sharing a bath.   The most common cause of bladder infections is bacteria from the bowels. The bacteria get onto the skin around the opening of the urethra. From there, they can get into the urine. Then they travel up to the bladder, causing inflammation and infection. This often happens because of:   Wiping incorrectly after urinating. Always wipe from front to back.  Bowel incontinence  Pregnancy. During pregnancy urinary tract changes raise the risk for infection.  Procedures such as having a catheter put in  Older age  Not emptying your bladder. This can give bacteria a chance to grow in your urine.  Fluid loss (dehydration)  Constipation  Having sex  Using a diaphragm for birth control   Treatment  Bladder infections are diagnosed by a urine test and urine culture. They are treated with antibiotics. They often clear up quickly without problems. Treatment helps prevent a more serious kidney infection.   Medicines  Medicines can help in the treatment of a bladder infection:  Take antibiotics until they are used up, even if you feel better. It's important to finish them to make sure the infection has cleared.  You can use acetaminophen or ibuprofen for pain, fever, or discomfort, unless another medicine was prescribed. If you have long-term (chronic) liver or kidney disease, talk with your healthcare provider before using these medicines. Also talk with your provider if you've ever had a stomach ulcer or GI (gastrointestinal) bleeding, or are taking blood-thinner medicines.  If you are given phenazopydridine to reduce burning with urination, it will make your urine a bright orange color. This can stain clothing.  Care and prevention  These self-care steps can help prevent future infections:  Drink plenty of fluids. This helps to prevent dehydration and flush out your bladder. Do this unless you must restrict fluids for other health reasons, or your healthcare provider told you not to.  Clean yourself correctly after going to the bathroom. Wipe from front to back after using the toilet. This helps prevent the spread of bacteria.  Urinate more often. Don't try to hold urine in for a long time.  Wear loose-fitting clothes and cotton underwear. Don't wear tight-fitting pants.  Improve your diet and prevent constipation. Eat more fresh fruits and vegetables, and fiber. Eat less junk foods and fatty foods.  Don't have sex until your symptoms are gone.  Don't have caffeine, alcohol, and spicy foods. These can irritate your bladder.  Urinate right after you have sex to flush out your bladder.  If you use birth control pills and have frequent bladder infections, discuss it with your healthcare provider.  Follow-up care  Call your healthcare provider if all symptoms are not gone after 3 days of treatment. This is especially important if you have repeat infections.   If a culture was done, you will be told if your treatment needs to be changed. If directed, you can call to find out the results.   If X-rays were done, you will be told if the results will affect your treatment.  Call 911  Call 911 if any of the following occur:   Trouble breathing  Hard to wake up or confusion  Fainting (loss of consciousness)  Fast heart rate  When to get medical advice  Call your healthcare provider right away if any  of these occur:  Fever of 100.4?F (38.0?C) or higher, or as directed by your healthcare provider  Symptoms are not better after 3 days of treatment  Symptoms get worse or you have new symptoms  Back or belly pain that gets worse  Repeated vomiting, or unable to keep medicine down  Weakness or dizziness  Vaginal discharge  Pain, redness, or swelling in the outer vaginal area (labia)  StayWell last reviewed this educational content on 11/13/2020  ? 2000-2024 [employer], [place]. All rights reserved. This information is not intended as a substitute for professional medical care. Always follow your healthcare professional's instructions.

## 2023-06-18 NOTE — Patient Instructions
Take the antibiotic as prescribed for the full amount of days until completed.  Drink 60 to 100 ounces of water a day for hydration.  If you start spiking any fevers or having severe kidney pain you need to go directly to an ER for further evaluation and possible IV treatment  Always discuss with your pharmacist any new medications that you take to make sure that they do not have any interactions with any current medications that you are taking.  If symptoms fail to improve, follow-up with your PCP  If symptoms worsen please get evaluated in the emergency room for further evaluation and treatment  If you have had any testing done you will only be notified of positive results.    All testing results will show in your My Chart portal if you have signed up for it.    Bladder Infection, Female (Adult)  Urine normally doesn't have any germs (bacteria) in it. But bacteria can get into the urinary tract from the skin around the rectum. Or they can travel in the blood from other parts of the body. Once they are in your urinary tract, they can cause infection in these areas:   The urethra (urethritis)  The bladder (cystitis)  The kidneys (pyelonephritis)  The most common place for an infection is in the bladder. This is called a bladder infection. This is one of the most common infections in women because women have a shorter urethra than men. Bacteria have a shorter distance to travel to reach the bladder.. Women who have gone through menopause also lose the protection from estrogen that lowers the chance of getting a UTI. And some women are at higher risk because of their genes.   Most bladder infections are easily treated. They are not serious unless the infection spreads to the kidney.   The terms bladder infection, UTI, and cystitis are often used to describe the same thing. But they are not always the same. Cystitis is an inflammation of the bladder. The most common cause of cystitis is an infection. Symptoms  The infection causes inflammation in the urethra and bladder. This causes many of the symptoms. The most common symptoms of a bladder infection are:   Pain or burning when urinating  Having to urinate more often than normal  Urgent need to urinate  Only a small amount of urine comes out  Blood in urine  Belly (abdominal) discomfort. This is often in the lower belly above the pubic bone.  Lower back pain  Cloudy urine  Strong- or bad-smelling urine  Unable to urinate (urinary retention)  Unable to hold urine in (urinary incontinence)  Fever  Loss of appetite  Confusion (in older adults)  Causes  Bladder infections are not contagious. You can't get one from someone else, from a toilet seat, or from sharing a bath.   The most common cause of bladder infections is bacteria from the bowels. The bacteria get onto the skin around the opening of the urethra. From there, they can get into the urine. Then they travel up to the bladder, causing inflammation and infection. This often happens because of:   Wiping incorrectly after urinating. Always wipe from front to back.  Bowel incontinence  Pregnancy. During pregnancy urinary tract changes raise the risk for infection.  Procedures such as having a catheter put in  Older age  Not emptying your bladder. This can give bacteria a chance to grow in your urine.  Fluid loss (dehydration)  Constipation  Having  sex  Using a diaphragm for birth control   Treatment  Bladder infections are diagnosed by a urine test and urine culture. They are treated with antibiotics. They often clear up quickly without problems. Treatment helps prevent a more serious kidney infection.   Medicines  Medicines can help in the treatment of a bladder infection:  Take antibiotics until they are used up, even if you feel better. It's important to finish them to make sure the infection has cleared.  You can use acetaminophen or ibuprofen for pain, fever, or discomfort, unless another medicine was prescribed. If you have long-term (chronic) liver or kidney disease, talk with your healthcare provider before using these medicines. Also talk with your provider if you've ever had a stomach ulcer or GI (gastrointestinal) bleeding, or are taking blood-thinner medicines.  If you are given phenazopydridine to reduce burning with urination, it will make your urine a bright orange color. This can stain clothing.  Care and prevention  These self-care steps can help prevent future infections:  Drink plenty of fluids. This helps to prevent dehydration and flush out your bladder. Do this unless you must restrict fluids for other health reasons, or your healthcare provider told you not to.  Clean yourself correctly after going to the bathroom. Wipe from front to back after using the toilet. This helps prevent the spread of bacteria.  Urinate more often. Don't try to hold urine in for a long time.  Wear loose-fitting clothes and cotton underwear. Don't wear tight-fitting pants.  Improve your diet and prevent constipation. Eat more fresh fruits and vegetables, and fiber. Eat less junk foods and fatty foods.  Don't have sex until your symptoms are gone.  Don't have caffeine, alcohol, and spicy foods. These can irritate your bladder.  Urinate right after you have sex to flush out your bladder.  If you use birth control pills and have frequent bladder infections, discuss it with your healthcare provider.  Follow-up care  Call your healthcare provider if all symptoms are not gone after 3 days of treatment. This is especially important if you have repeat infections.   If a culture was done, you will be told if your treatment needs to be changed. If directed, you can call to find out the results.   If X-rays were done, you will be told if the results will affect your treatment.  Call 911  Call 911 if any of the following occur:   Trouble breathing  Hard to wake up or confusion  Fainting (loss of consciousness)  Fast heart rate  When to get medical advice  Call your healthcare provider right away if any of these occur:  Fever of 100.4?F (38.0?C) or higher, or as directed by your healthcare provider  Symptoms are not better after 3 days of treatment  Symptoms get worse or you have new symptoms  Back or belly pain that gets worse  Repeated vomiting, or unable to keep medicine down  Weakness or dizziness  Vaginal discharge  Pain, redness, or swelling in the outer vaginal area (labia)  StayWell last reviewed this educational content on 11/13/2020  ? 2000-2024 [employer], [place]. All rights reserved. This information is not intended as a substitute for professional medical care. Always follow your healthcare professional's instructions.

## 2023-06-26 ENCOUNTER — Encounter: Admit: 2023-06-26 | Discharge: 2023-06-26 | Payer: BC Managed Care – PPO

## 2023-07-27 ENCOUNTER — Encounter: Admit: 2023-07-27 | Discharge: 2023-07-27 | Payer: BC Managed Care – PPO

## 2023-07-27 ENCOUNTER — Ambulatory Visit: Admit: 2023-07-27 | Discharge: 2023-07-28 | Payer: BC Managed Care – PPO

## 2023-07-27 DIAGNOSIS — N390 Urinary tract infection, site not specified: Secondary | ICD-10-CM

## 2023-07-27 DIAGNOSIS — J302 Other seasonal allergic rhinitis: Secondary | ICD-10-CM

## 2023-07-27 DIAGNOSIS — U071 COVID-19: Secondary | ICD-10-CM

## 2023-07-27 NOTE — Progress Notes
Patient is 27 y.o. G1P0000 [redacted]w[redacted]d by Patient's last menstrual period was 06/21/2023 (exact date).  Pregnancy confirmed by urine pregnancy test .    IPV date: 08/24/23  GA at IPV: [redacted]w[redacted]d  Request for earlier appointment: No    27 y.o. G1P0000 at [redacted]w[redacted]d gestational age. Currently taking a prenatal vitamin. This is an unplanned but desired pregnancy. Review medical history and current medications below. Ordered prenatal labs and advised to have labs drawn prior to visit. Ordered u/s and provided the phone number for the [facility] schedulers. Encouraged to communicate with nurse team via MyChart and provided the phone number for the team. Informed that RN will send educational resources, safe medication list, and DHA survey information via MyChart. Encouraged to complete DHA survey and advised that DHA is a nutrient found in fish, eggs, and many prenatal vitamins. Informed that consuming enough DHA from food and prenatal supplements can reduce the risk of preterm birth.     Provided intake education. Reviewed food safety and advised to cook all meat, eggs, and seafood thoroughly. Advised to avoid unpasteurized milk and foods made with unpasteurized milk, including soft cheeses.   Has cats at home. Instructed to avoid changing cat litter and reviewed precautions regarding gardening to avoid toxoplasmosis. Reviewed schedule of prenatal visits.   Advised that [facility] an [facility] medical center and teaching facility, therefore we have residents and medical students that may be involved in patient care. Patient is aware that we have a diverse group of providers on labor and delivery and therefore cannot guarantee an all female call team for delivery. All questions answered. Patient verbalized understanding. In case of an emergency, this patient will accept blood or blood products. Routed to Dr. Benjaman Pott for review.        OB History   Gravida Para Term Preterm AB Living   1 0 0 0 0 0   SAB IAB Ectopic Multiple Live Births   0 0 0 0 0 # Outcome Date GA Lbr Len/2nd Weight Sex Type Anes PTL Lv   1 Current                Past Medical History:   Diagnosis Date    COVID-19 2021    Seasonal allergies     UTI (urinary tract infection) 06/2023     History reviewed. No pertinent surgical history.  Family History   Problem Relation Name Age of Onset    Anemia Mother      None Reported Father      None Reported Sister      None Reported Brother      None Reported Maternal Grandmother      Alzheimer's Maternal Grandfather      None Reported Paternal Grandmother      None Reported Paternal Grandfather      None Reported Sister       Social History     Socioeconomic History    Marital status: Single    Number of children: 0   Tobacco Use    Smoking status: Never    Smokeless tobacco: Never   Vaping Use    Vaping status: Never Used   Substance and Sexual Activity    Alcohol use: Not Currently     Comment: on the weekends socially    Drug use: Never    Sexual activity: Yes     Partners: Male     Birth control/protection: Condom     Comment: monogamous recently with boyfriend  Vaping/E-liquid Use    Vaping Use Never User                 Social History     Tobacco Use   Smoking Status Never   Smokeless Tobacco Never     Social History     Substance and Sexual Activity   Alcohol Use Not Currently    Comment: on the weekends socially     Social History     Substance and Sexual Activity   Drug Use Never       No current outpatient medications on file.    Initial prenatal labs ordered Yes  NT ordered Yes  AFP No  Quad Screening No  Requested outside or operative records No  DHA Survery Sent via MyChart: Yes    Appointment scheduled with  Dr. Benjaman Pott  on 08/24/23.  Future Appointments   Date Time Provider Department Center   08/24/2023  9:30 AM Toya Smothers, MD [facility]   09/12/2023  3:00 PM OBGYN [facility] 5 [facility]   09/12/2023  3:30 PM OBGYN ULTRA SOUND CONSULT [facility]

## 2023-07-28 DIAGNOSIS — Z3490 Encounter for supervision of normal pregnancy, unspecified, unspecified trimester: Secondary | ICD-10-CM

## 2023-08-22 ENCOUNTER — Encounter: Admit: 2023-08-22 | Discharge: 2023-08-22 | Payer: BC Managed Care – PPO

## 2023-08-22 ENCOUNTER — Ambulatory Visit: Admit: 2023-08-22 | Discharge: 2023-08-22 | Payer: BC Managed Care – PPO

## 2023-08-22 DIAGNOSIS — Z3490 Encounter for supervision of normal pregnancy, unspecified, unspecified trimester: Secondary | ICD-10-CM

## 2023-08-22 LAB — SYPHILIS AB SCREEN: SYPHILIS AB, TOTAL: NEGATIVE

## 2023-08-22 LAB — CBC
HEMATOCRIT: 36 % (ref 36–45)
HEMOGLOBIN: 11 g/dL — ABNORMAL LOW (ref 12.0–15.0)
MCH: 23 pg — ABNORMAL LOW (ref 26–34)
MCHC: 32 g/dL (ref 32.0–36.0)
MPV: 7.3 FL (ref 7–11)
PLATELET COUNT: 394 10*3/uL (ref 150–400)
RBC COUNT: 4.9 M/UL (ref 4.0–5.0)
RDW: 19 % — ABNORMAL HIGH (ref 11–15)
WBC COUNT: 9.8 10*3/uL (ref 4.5–11.0)

## 2023-08-22 LAB — HEPATITIS B SURFACE AG

## 2023-08-22 LAB — RUBELLA AB IGG

## 2023-08-22 LAB — HEMOGLOBIN A1C: HEMOGLOBIN A1C: 5.5 % (ref 4.0–5.7)

## 2023-08-22 LAB — HEPATITIS C ANTIBODY W REFLEX HCV PCR QUANT

## 2023-08-22 LAB — HIV 1 & 2 AG-AB SCRN W REFLEX TO HIV CONFIRMATION

## 2023-08-23 LAB — CHLAM/NG PCR URINE
CHLAMYDIA TRACH PCR: NEGATIVE
NEISSERIA GONORROEAE PCR: NEGATIVE

## 2023-08-24 ENCOUNTER — Encounter: Admit: 2023-08-24 | Discharge: 2023-08-24 | Payer: BC Managed Care – PPO

## 2023-08-24 ENCOUNTER — Ambulatory Visit: Admit: 2023-08-24 | Discharge: 2023-08-25 | Payer: BC Managed Care – PPO

## 2023-08-24 DIAGNOSIS — U071 COVID-19: Secondary | ICD-10-CM

## 2023-08-24 DIAGNOSIS — Z3490 Encounter for supervision of normal pregnancy, unspecified, unspecified trimester: Secondary | ICD-10-CM

## 2023-08-24 DIAGNOSIS — J302 Other seasonal allergic rhinitis: Secondary | ICD-10-CM

## 2023-08-24 DIAGNOSIS — N390 Urinary tract infection, site not specified: Secondary | ICD-10-CM

## 2023-08-24 MED ORDER — PROCHLORPERAZINE MALEATE 10 MG PO TAB
10 mg | ORAL_TABLET | ORAL | 1 refills | Status: AC | PRN
Start: 2023-08-24 — End: ?

## 2023-08-24 MED ORDER — ONDANSETRON 4 MG PO TBDI
4 mg | ORAL_TABLET | ORAL | 1 refills | 8.00000 days | Status: AC | PRN
Start: 2023-08-24 — End: ?

## 2023-09-12 ENCOUNTER — Encounter: Admit: 2023-09-12 | Discharge: 2023-09-12 | Payer: BC Managed Care – PPO

## 2023-09-12 ENCOUNTER — Ambulatory Visit: Admit: 2023-09-12 | Discharge: 2023-09-12 | Payer: BC Managed Care – PPO

## 2023-09-12 DIAGNOSIS — Z3490 Encounter for supervision of normal pregnancy, unspecified, unspecified trimester: Secondary | ICD-10-CM

## 2023-09-12 DIAGNOSIS — Z1379 Encounter for other screening for genetic and chromosomal anomalies: Secondary | ICD-10-CM

## 2023-10-09 ENCOUNTER — Encounter: Admit: 2023-10-09 | Discharge: 2023-10-09 | Payer: BC Managed Care – PPO

## 2023-10-26 ENCOUNTER — Encounter: Admit: 2023-10-26 | Discharge: 2023-10-26 | Payer: BC Managed Care – PPO

## 2023-11-16 ENCOUNTER — Encounter: Admit: 2023-11-16 | Discharge: 2023-11-16 | Payer: BC Managed Care – PPO

## 2023-11-16 ENCOUNTER — Ambulatory Visit: Admit: 2023-11-16 | Discharge: 2023-11-16 | Payer: BC Managed Care – PPO

## 2023-11-16 ENCOUNTER — Ambulatory Visit: Admit: 2023-11-16 | Discharge: 2023-11-17 | Payer: BC Managed Care – PPO

## 2023-11-16 DIAGNOSIS — Z3490 Encounter for supervision of normal pregnancy, unspecified, unspecified trimester: Secondary | ICD-10-CM

## 2023-11-21 ENCOUNTER — Encounter: Admit: 2023-11-21 | Discharge: 2023-11-21 | Payer: BC Managed Care – PPO

## 2023-11-21 DIAGNOSIS — Z3490 Encounter for supervision of normal pregnancy, unspecified, unspecified trimester: Secondary | ICD-10-CM

## 2023-11-22 ENCOUNTER — Encounter: Admit: 2023-11-22 | Discharge: 2023-11-22 | Payer: BC Managed Care – PPO

## 2023-12-11 ENCOUNTER — Encounter: Admit: 2023-12-11 | Discharge: 2023-12-11 | Payer: BC Managed Care – PPO

## 2023-12-11 ENCOUNTER — Ambulatory Visit: Admit: 2023-12-11 | Discharge: 2023-12-12 | Payer: BC Managed Care – PPO

## 2023-12-11 DIAGNOSIS — Z3A28 28 weeks gestation of pregnancy: Secondary | ICD-10-CM

## 2023-12-11 NOTE — Progress Notes
Return OB Visit    Subjective:  Christine Bush is a 28 y.o.  G1P0000 @ [redacted]w[redacted]d here today for OB follow up.    Patient without complaint. Is still awaiting insurance transition which will happen in next month. She plans to do genetic testing at that point along with repeat US.   She needs to reschedule a couple appts, but has no major complaints.     Reports good fetal movement.     Pregnancy Complicated by:  EIF and absent nasal bone, plans for NIPS however not collected yet (due to insurance)   Anx/Dep       ROS  Denies regular contractions, vaginal bleeding, loss of fluid.  No headaches, scotoma, RUQ pain, dysuria, CP, SOA. Notes active fetal movement      Objective  Vitals:    12/11/23 1015   BP: 116/72   Pulse: 83   Temp: 36.6 ?C (97.8 ?F)     General: No acute distress  HEENT: Normocephalic, atraumatic  Respiratory: Nonlabored respirations, symmetrical expansion  Abdomen: Soft, nontender to palpation, gravid  Musculoskeletal: Normal gait, no deformity  Extremities: No cyanosis, clubbing,evidence of DVT  Neurologic: Alert and oriented x3, no focal deficits  Psychiatric: No suicidal or homicidal ideations    FHT: 140s  FH: 27cm  SVE: n/a      Assessment/ Plan:  Pregnancy:  -dated by:L=11  -Initial prenatal labs:   Lab Results   Component Value Date    ABORHD O POS 08/22/2023    ABSCRN NEG 08/22/2023    RUBELLA IMMUNE 08/22/2023    HEPBSAG NONREACTIVE 08/22/2023    HIV12AGABSCN NONREACTIVE 08/22/2023    SYPHILISABTO NEG 08/22/2023    CHLTRACPCR NEG 08/22/2023    NGONORPCR NEG 08/22/2023    CHLAMYDIA NEG 09/22/2020    GONORRHEA NEG 09/22/2020         1) Routine Prenatal Care  Estimated Date of Delivery: 03/27/24 by L=11  Initial labs reviewed  NT/Genetic counseling completed prev. Considering NIPS.   Flu shot will get through school   COVID vaccine, discuss next visit   DUS at 20 weeks, with EIF and absent nasal bone  3T labs at 26-28 weeks, discussed and ordered   Tdap at 28 weeks, next visit   GBS at 36 weeks  PP BCM: considering Copper IUD   Delivery Plan: SVD  Reviewed practice model.  Patient informed that residents will be part of her care and most likely her delivery will be performed by the obstetrician on call at the hospital.      2) NVP, resolved   Zofran and compazine Rx provided      3) UTI this preg  S/p tx and neg TOC      4) Anxiety and Depression  Therapy referral placed  Declines medication at this time     5) ASA ppx   G1 pregnancy and AA  Discussed in early second trimester     6) EIF and absent nasal bone on anatomy US  Plans for NIPS. She has number to schedule when she gets her insurance switched   Repeat growth at 32 weeks      RTC in 4 weeks. Precautions discussed.       Future Appointments   Date Time Provider Department Center   01/02/2024  8:00 AM Toya Smothers, MD [facility]   01/29/2024  8:00 AM Toya Smothers, MD [facility]   02/08/2024  3:00 PM OBGYN [facility] 2 [facility]  02/15/2024  2:00 PM Marily Lente, MD [facility]   02/26/2024  8:00 AM Marily Lente, MD [facility]   03/04/2024  8:00 AM Marily Lente, MD [facility]   03/12/2024  4:00 PM Beecher Mcardle, APRN-NP [facility]   03/19/2024  8:15 AM Beecher Mcardle, APRN-NP [facility]   04/29/2024  9:00 AM Toya Smothers, MD [facility]      Toya Smothers, MD

## 2023-12-27 ENCOUNTER — Encounter: Admit: 2023-12-27 | Discharge: 2023-12-27 | Payer: BC Managed Care – PPO

## 2024-01-29 ENCOUNTER — Encounter: Admit: 2024-01-29 | Discharge: 2024-01-29 | Payer: PRIVATE HEALTH INSURANCE

## 2024-03-19 ENCOUNTER — Encounter: Admit: 2024-03-19 | Discharge: 2024-03-19 | Payer: PRIVATE HEALTH INSURANCE

## 2024-04-29 ENCOUNTER — Encounter: Admit: 2024-04-29 | Discharge: 2024-04-29 | Payer: PRIVATE HEALTH INSURANCE

## 2024-06-19 ENCOUNTER — Encounter: Admit: 2024-06-19 | Discharge: 2024-06-19 | Payer: PRIVATE HEALTH INSURANCE

## 2024-11-21 ENCOUNTER — Encounter: Admit: 2024-11-21 | Discharge: 2024-11-21 | Payer: PRIVATE HEALTH INSURANCE
